# Patient Record
Sex: Female | Born: 1955 | Hispanic: No | Marital: Married | Smoking: Never smoker
Health system: Southern US, Community
[De-identification: ages and names within clinical notes are randomized; demographics above are authoritative.]

## PROBLEM LIST (undated history)

## (undated) DIAGNOSIS — E1159 Type 2 diabetes mellitus with other circulatory complications: Secondary | ICD-10-CM

## (undated) DIAGNOSIS — I152 Hypertension secondary to endocrine disorders: Secondary | ICD-10-CM

## (undated) DIAGNOSIS — Z8673 Personal history of transient ischemic attack (TIA), and cerebral infarction without residual deficits: Secondary | ICD-10-CM

## (undated) DIAGNOSIS — E119 Type 2 diabetes mellitus without complications: Secondary | ICD-10-CM

## (undated) DIAGNOSIS — M069 Rheumatoid arthritis, unspecified: Secondary | ICD-10-CM

## (undated) DIAGNOSIS — I10 Essential (primary) hypertension: Secondary | ICD-10-CM

---

## 2021-06-05 ENCOUNTER — Other Ambulatory Visit: Payer: Self-pay

## 2021-06-05 ENCOUNTER — Encounter (HOSPITAL_BASED_OUTPATIENT_CLINIC_OR_DEPARTMENT_OTHER): Payer: Self-pay | Admitting: *Deleted

## 2021-06-05 ENCOUNTER — Observation Stay (HOSPITAL_BASED_OUTPATIENT_CLINIC_OR_DEPARTMENT_OTHER)
Admission: EM | Admit: 2021-06-05 | Discharge: 2021-06-06 | Disposition: A | Discharge status: Home / Self Care | Payer: Medicaid Other | Attending: Internal Medicine | Admitting: Internal Medicine

## 2021-06-05 ENCOUNTER — Emergency Department (HOSPITAL_BASED_OUTPATIENT_CLINIC_OR_DEPARTMENT_OTHER): Payer: Medicaid Other

## 2021-06-05 ENCOUNTER — Emergency Department (HOSPITAL_COMMUNITY): Payer: Medicaid Other

## 2021-06-05 DIAGNOSIS — Z794 Long term (current) use of insulin: Secondary | ICD-10-CM | POA: Diagnosis not present

## 2021-06-05 DIAGNOSIS — Z7984 Long term (current) use of oral hypoglycemic drugs: Secondary | ICD-10-CM | POA: Insufficient documentation

## 2021-06-05 DIAGNOSIS — E1159 Type 2 diabetes mellitus with other circulatory complications: Secondary | ICD-10-CM | POA: Diagnosis present

## 2021-06-05 DIAGNOSIS — I639 Cerebral infarction, unspecified: Principal | ICD-10-CM | POA: Diagnosis present

## 2021-06-05 DIAGNOSIS — I10 Essential (primary) hypertension: Secondary | ICD-10-CM | POA: Diagnosis not present

## 2021-06-05 DIAGNOSIS — D696 Thrombocytopenia, unspecified: Secondary | ICD-10-CM | POA: Diagnosis not present

## 2021-06-05 DIAGNOSIS — Z79899 Other long term (current) drug therapy: Secondary | ICD-10-CM | POA: Diagnosis not present

## 2021-06-05 DIAGNOSIS — Z20822 Contact with and (suspected) exposure to covid-19: Secondary | ICD-10-CM | POA: Insufficient documentation

## 2021-06-05 DIAGNOSIS — E119 Type 2 diabetes mellitus without complications: Secondary | ICD-10-CM | POA: Diagnosis not present

## 2021-06-05 DIAGNOSIS — R202 Paresthesia of skin: Secondary | ICD-10-CM | POA: Diagnosis present

## 2021-06-05 DIAGNOSIS — R42 Dizziness and giddiness: Secondary | ICD-10-CM

## 2021-06-05 DIAGNOSIS — R200 Anesthesia of skin: Secondary | ICD-10-CM

## 2021-06-05 DIAGNOSIS — H538 Other visual disturbances: Secondary | ICD-10-CM

## 2021-06-05 HISTORY — DX: Type 2 diabetes mellitus without complications: E11.9

## 2021-06-05 HISTORY — DX: Essential (primary) hypertension: I10

## 2021-06-05 HISTORY — DX: Personal history of transient ischemic attack (TIA), and cerebral infarction without residual deficits: Z86.73

## 2021-06-05 HISTORY — DX: Type 2 diabetes mellitus with other circulatory complications: E11.59

## 2021-06-05 HISTORY — DX: Hypertension secondary to endocrine disorders: I15.2

## 2021-06-05 HISTORY — DX: Rheumatoid arthritis, unspecified: M06.9

## 2021-06-05 LAB — URINALYSIS, MICROSCOPIC (REFLEX)

## 2021-06-05 LAB — CBC
HCT: 44.4 % (ref 36.0–46.0)
Hemoglobin: 15.5 g/dL — ABNORMAL HIGH (ref 12.0–15.0)
MCH: 31.3 pg (ref 26.0–34.0)
MCHC: 34.9 g/dL (ref 30.0–36.0)
MCV: 89.7 fL (ref 80.0–100.0)
Platelets: 94 10*3/uL — ABNORMAL LOW (ref 150–400)
RBC: 4.95 MIL/uL (ref 3.87–5.11)
RDW: 14 % (ref 11.5–15.5)
WBC: 8.4 10*3/uL (ref 4.0–10.5)
nRBC: 0 % (ref 0.0–0.2)

## 2021-06-05 LAB — COMPREHENSIVE METABOLIC PANEL
ALT: 17 U/L (ref 0–44)
AST: 22 U/L (ref 15–41)
Albumin: 4.4 g/dL (ref 3.5–5.0)
Alkaline Phosphatase: 59 U/L (ref 38–126)
Anion gap: 12 (ref 5–15)
BUN: 17 mg/dL (ref 8–23)
CO2: 27 mmol/L (ref 22–32)
Calcium: 10.1 mg/dL (ref 8.9–10.3)
Chloride: 101 mmol/L (ref 98–111)
Creatinine, Ser: 0.97 mg/dL (ref 0.44–1.00)
GFR, Estimated: >60 mL/min (ref >60)
Glucose, Bld: 182 mg/dL — ABNORMAL HIGH (ref 70–99)
Potassium: 4 mmol/L (ref 3.5–5.1)
Sodium: 140 mmol/L (ref 135–145)
Total Bilirubin: 0.5 mg/dL (ref 0.3–1.2)
Total Protein: 7.5 g/dL (ref 6.5–8.1)

## 2021-06-05 LAB — PROTIME-INR
INR: 1 (ref 0.8–1.2)
Prothrombin Time: 13.5 seconds (ref 11.4–15.2)

## 2021-06-05 LAB — DIFFERENTIAL
Abs Immature Granulocytes: 0.03 10*3/uL (ref 0.00–0.07)
Basophils Absolute: 0 10*3/uL (ref 0.0–0.1)
Basophils Relative: 1 %
Eosinophils Absolute: 0.2 10*3/uL (ref 0.0–0.5)
Eosinophils Relative: 3 %
Immature Granulocytes: 0 %
Lymphocytes Relative: 28 %
Lymphs Abs: 2.4 10*3/uL (ref 0.7–4.0)
Monocytes Absolute: 0.7 10*3/uL (ref 0.1–1.0)
Monocytes Relative: 9 %
Neutro Abs: 5 10*3/uL (ref 1.7–7.7)
Neutrophils Relative %: 59 %
Smear Review: DECREASED

## 2021-06-05 LAB — URINALYSIS, ROUTINE W REFLEX MICROSCOPIC
Bilirubin Urine: NEGATIVE
Glucose, UA: >=500 mg/dL — AB
Ketones, ur: NEGATIVE mg/dL
Nitrite: NEGATIVE
Protein, ur: NEGATIVE mg/dL
Specific Gravity, Urine: 1.01 (ref 1.005–1.030)
pH: 6.5 (ref 5.0–8.0)

## 2021-06-05 LAB — RESP PANEL BY RT-PCR (FLU A&B, COVID) ARPGX2
Influenza A by PCR: NEGATIVE
Influenza B by PCR: NEGATIVE
SARS Coronavirus 2 by RT PCR: NEGATIVE

## 2021-06-05 LAB — RAPID URINE DRUG SCREEN, HOSP PERFORMED
Amphetamines: NOT DETECTED
Barbiturates: NOT DETECTED
Benzodiazepines: NOT DETECTED
Cocaine: NOT DETECTED
Opiates: NOT DETECTED
Tetrahydrocannabinol: NOT DETECTED

## 2021-06-05 LAB — APTT: aPTT: 24 seconds (ref 24–36)

## 2021-06-05 MED ORDER — GADOBUTROL 1 MMOL/ML IV SOLN
6.5000 mL | Freq: Once | INTRAVENOUS | Status: AC | PRN
  Administered 2021-06-05: 6.5 mL via INTRAVENOUS

## 2021-06-05 MED ORDER — ASPIRIN 81 MG PO CHEW
324.0000 mg | CHEWABLE_TABLET | Freq: Once | ORAL | Status: AC
  Administered 2021-06-06: 324 mg via ORAL
  Filled 2021-06-05: qty 4

## 2021-06-05 NOTE — ED Notes (Signed)
ED TO INPATIENT HANDOFF REPORT  ED Nurse Name and Phone #:   S Name/Age/Gender Shannon Bender 65 y.o. female Room/Bed: MH12/MH12  Code Status   Code Status: Not on file  Home/SNF/Other [facility] ED Patient oriented to: self, place, time and situation Is this baseline? Yes   Triage Complete: Triage complete  Chief Complaint head numbess and facial numbness  Triage Note Head and facial numbness since 4am (11 hours ago). Pressure started in both ears then into her head. Blurred vision. They went to a walk in clinic where they were told to come to the ER.     Allergies No Known Allergies  Level of Care/Admitting Diagnosis ED Disposition    ED Disposition  Transfer to Another Facility   Condition  --   Comment  The patient appears reasonably stabilized for transfer considering the current resources, flow, and capabilities available in the ED at this time, and I doubt any other [facility] requiring further screening and/or treatment in the ED prior to transfer is p resent.         B Medical/Surgery History Past Medical History:  Diagnosis Date  . Diabetes mellitus without complication (HCC)   . Hypertension    Past Surgical History:  Procedure Laterality Date  . CESAREAN SECTION       A IV Location/Drains/Wounds Patient Lines/Drains/Airways Status    Active Line/Drains/Airways    Name Placement date Placement time Site Days   Peripheral IV 06/05/21 20 G 1" Left Forearm 06/05/21  1544  Forearm  less than 1          Intake/Output Last 24 hours No intake or output data in the 24 hours ending 06/05/21 1904  Labs/Imaging Results for orders placed or performed during the hospital encounter of 06/05/21 (from the past 48 hour(s))  Resp Panel by RT-PCR (Flu A&B, Covid) Nasopharyngeal Swab     Status: None   Collection Time: 06/05/21  4:00 PM   Specimen: Nasopharyngeal Swab; Nasopharyngeal(NP) swabs in vial transport medium  Result Value Ref Range   SARS Coronavirus 2 by RT  PCR NEGATIVE NEGATIVE    Comment: (NOTE) SARS-CoV-2 target nucleic acids are NOT DETECTED.  The SARS-CoV-2 RNA is generally detectable in upper respiratory specimens during the acute phase of infection. The lowest concentration of SARS-CoV-2 viral copies this assay can detect is 138 copies/mL. A negative result does not preclude SARS-Cov-2 infection and should not be used as the sole basis for treatment or other patient management decisions. A negative result may occur with  improper specimen collection/handling, submission of specimen other than nasopharyngeal swab, presence of viral mutation(s) within the areas targeted by this assay, and inadequate number of viral copies(<138 copies/mL). A negative result must be combined with clinical observations, patient history, and epidemiological information. The expected result is Negative.  Fact Sheet for Patients:  BloggerCourse.com  Fact Sheet for Healthcare Providers:  SeriousBroker.it  This test is no t yet approved or cleared by the [place] FDA and  has been authorized for detection and/or diagnosis of SARS-CoV-2 by FDA under an Emergency Use Authorization (EUA). This EUA will remain  in effect (meaning this test can be used) for the duration of the COVID-19 declaration under Section 564(b)(1) of the Act, 21 U.S.C.section 360bbb-3(b)(1), unless the authorization is terminated  or revoked sooner.       Influenza A by PCR NEGATIVE NEGATIVE   Influenza B by PCR NEGATIVE NEGATIVE    Comment: (NOTE) The Xpert Xpress SARS-CoV-2/FLU/RSV plus assay is  intended as an aid in the diagnosis of influenza from Nasopharyngeal swab specimens and should not be used as a sole basis for treatment. Nasal washings and aspirates are unacceptable for Xpert Xpress SARS-CoV-2/FLU/RSV testing.  Fact Sheet for Patients: BloggerCourse.com  Fact Sheet for Healthcare  Providers: SeriousBroker.it  This test is not yet approved or cleared by the [place] FDA and has been authorized for detection and/or diagnosis of SARS-CoV-2 by FDA under an Emergency Use Authorization (EUA). This EUA will remain in effect (meaning this test can be used) for the duration of the COVID-19 declaration under Section 564(b)(1) of the Act, 21 U.S.C. section 360bbb-3(b)(1), unless the authorization is terminated or revoked.  Performed at [facility], [address] [place], [place] [address]   Protime-INR     Status: None   Collection Time: 06/05/21  4:00 PM  Result Value Ref Range   Prothrombin Time 13.5 11.4 - 15.2 seconds   INR 1.0 0.8 - 1.2    Comment: (NOTE) INR goal varies based on device and disease states. Performed at [facility], [address] [place], [place] [address]   APTT     Status: None   Collection Time: 06/05/21  4:00 PM  Result Value Ref Range   aPTT 24 24 - 36 seconds    Comment: Performed at [facility], [address] [place], [place] [address]  CBC     Status: Abnormal   Collection Time: 06/05/21  4:00 PM  Result Value Ref Range   WBC 8.4 4.0 - 10.5 K/uL   RBC 4.95 3.87 - 5.11 MIL/uL   Hemoglobin 15.5 (H) 12.0 - 15.0 g/dL   HCT 05.3 97.6 - 73.4 %   MCV 89.7 80.0 - 100.0 fL   MCH 31.3 26.0 - 34.0 pg   MCHC 34.9 30.0 - 36.0 g/dL   RDW 19.3 79.0 - 24.0 %   Platelets 94 (L) 150 - 400 K/uL    Comment: SPECIMEN CHECKED FOR CLOTS Immature Platelet Fraction may be clinically indicated, consider ordering this additional test [id number] PLATELET COUNT CONFIRMED BY SMEAR    nRBC 0.0 0.0 - 0.2 %    Comment: Performed at [facility], [address] [place], [place] [address]  Differential     Status: None   Collection Time: 06/05/21  4:00 PM  Result Value Ref Range   Neutrophils Relative % 59 %   Neutro Abs 5.0 1.7 - 7.7 K/uL   Lymphocytes Relative 28 %    Lymphs Abs 2.4 0.7 - 4.0 K/uL   Monocytes Relative 9 %   Monocytes Absolute 0.7 0.1 - 1.0 K/uL   Eosinophils Relative 3 %   Eosinophils Absolute 0.2 0.0 - 0.5 K/uL   Basophils Relative 1 %   Basophils Absolute 0.0 0.0 - 0.1 K/uL   WBC Morphology MORPHOLOGY UNREMARKABLE    RBC Morphology MORPHOLOGY UNREMARKABLE    Smear Review PLATELETS APPEAR DECREASED     Comment: PLATELET COUNT CONFIRMED BY SMEAR   Immature Granulocytes 0 %   Abs Immature Granulocytes 0.03 0.00 - 0.07 K/uL    Comment: Performed at [facility], [address] [place], [place] [address]  Comprehensive metabolic panel     Status: Abnormal   Collection Time: 06/05/21  4:00 PM  Result Value Ref Range   Sodium 140 135 - 145 mmol/L   Potassium 4.0 3.5 - 5.1 mmol/L   Chloride 101  98 - 111 mmol/L   CO2 27 22 - 32 mmol/L   Glucose, Bld 182 (H) 70 - 99 mg/dL    Comment: Glucose reference range applies only to samples taken after fasting for at least 8 hours.   BUN 17 8 - 23 mg/dL   Creatinine, Ser 1.61 0.44 - 1.00 mg/dL   Calcium 09.6 8.9 - 04.5 mg/dL   Total Protein 7.5 6.5 - 8.1 g/dL   Albumin 4.4 3.5 - 5.0 g/dL   AST 22 15 - 41 U/L   ALT 17 0 - 44 U/L   Alkaline Phosphatase 59 38 - 126 U/L   Total Bilirubin 0.5 0.3 - 1.2 mg/dL   GFR, Estimated >40 >98 mL/min    Comment: (NOTE) Calculated using the CKD-EPI Creatinine Equation (2021)    Anion gap 12 5 - 15    Comment: Performed at [facility], [address] [place], [place] [address]  Urine rapid drug screen (hosp performed)     Status: None   Collection Time: 06/05/21  4:54 PM  Result Value Ref Range   Opiates NONE DETECTED NONE DETECTED   Cocaine NONE DETECTED NONE DETECTED   Benzodiazepines NONE DETECTED NONE DETECTED   Amphetamines NONE DETECTED NONE DETECTED   Tetrahydrocannabinol NONE DETECTED NONE DETECTED   Barbiturates NONE DETECTED NONE DETECTED    Comment: (NOTE) DRUG SCREEN FOR MEDICAL PURPOSES ONLY.  IF CONFIRMATION IS  NEEDED FOR ANY PURPOSE, NOTIFY LAB WITHIN 5 DAYS.  LOWEST DETECTABLE LIMITS FOR URINE DRUG SCREEN Drug Class                     Cutoff (ng/mL) Amphetamine and metabolites    1000 Barbiturate and metabolites    200 Benzodiazepine                 200 Tricyclics and metabolites     300 Opiates and metabolites        300 Cocaine and metabolites        300 THC                            50 Performed at [facility], [address] [place], [place] [address]   Urinalysis, Routine w reflex microscopic Urine, Clean Catch     Status: Abnormal   Collection Time: 06/05/21  4:54 PM  Result Value Ref Range   Color, Urine YELLOW YELLOW   APPearance HAZY (A) CLEAR   Specific Gravity, Urine 1.010 1.005 - 1.030   pH 6.5 5.0 - 8.0   Glucose, UA >=500 (A) NEGATIVE mg/dL   Hgb urine dipstick SMALL (A) NEGATIVE   Bilirubin Urine NEGATIVE NEGATIVE   Ketones, ur NEGATIVE NEGATIVE mg/dL   Protein, ur NEGATIVE NEGATIVE mg/dL   Nitrite NEGATIVE NEGATIVE   Leukocytes,Ua MODERATE (A) NEGATIVE    Comment: Performed at [facility], [address] [place], [place] [address]  Urinalysis, Microscopic (reflex)     Status: Abnormal   Collection Time: 06/05/21  4:54 PM  Result Value Ref Range   RBC / HPF 0-5 0 - 5 RBC/hpf   WBC, UA 21-50 0 - 5 WBC/hpf   Bacteria, UA RARE (A) NONE SEEN   Squamous Epithelial / LPF 0-5 0 - 5    Comment: Performed at [facility], [address] [place], [place] [address]   CT HEAD WO CONTRAST  Result Date: 06/05/2021 CLINICAL DATA:  Neuro deficit, acute stroke suspected. Head and facial numbness since 4 a.m. (11 hours prior). EXAM: CT HEAD WITHOUT CONTRAST TECHNIQUE: Contiguous axial images were obtained from the base of the skull through the vertex without intravenous contrast. COMPARISON:  None. FINDINGS: Brain: There is no evidence of acute intracranial hemorrhage, mass lesion, brain edema or extra-axial fluid collection. There is mild  atrophy with prominence of the ventricles and subarachnoid spaces. Patchy low-density in the periventricular white matter is likely related to chronic small vessel ischemic changes. There is a small lacune within the right caudate body. There is no CT evidence of acute cortical infarction. Vascular: Prominent intracranial vascular calcifications. No hyperdense vessel identified. Skull: Negative for fracture or focal lesion. Sinuses/Orbits: The visualized paranasal sinuses and mastoid air cells are clear. No orbital abnormalities are seen. Other: None. IMPRESSION: 1. Age indeterminate periventricular white matter disease, likely due to chronic small vessel ischemic changes. 2. No CT evidence of acute cortical infarct or acute intracranial hemorrhage. Electronically Signed   By: Carey BullocksWilliam  Veazey M.D.   On: 06/05/2021 17:37    Pending Labs Unresulted Labs (From admission, onward)    Start     Ordered   06/05/21 1744  Urine Culture  Once,   STAT       Question:  Indication  Answer:  Altered mental status (if no other cause identified)   06/05/21 1744   06/05/21 1548  Ethanol  ONCE - STAT,   STAT        06/05/21 1548          Vitals/Pain Today's Vitals   06/05/21 1615 06/05/21 1630 06/05/21 1724 06/05/21 1807  BP:   (!) 150/68 (!) 141/79  Pulse: 78 81 91 79  Resp: 16 19 18 17   Temp:   98.3 F (36.8 C) 98.2 F (36.8 C)  TempSrc:   Oral Oral  SpO2: 95% 95% 96% 93%  Weight:      Height:      PainSc:        Isolation Precautions No active isolations  Medications Medications - No data to display  Mobility walks with person assist High fall risk   Focused Assessments Neuro Assessment Handoff:  Swallow screen pass? Yes    NIH Stroke Scale ( + Modified Stroke Scale Criteria)  Interval: Initial Level of Consciousness (1a.)   : Alert, keenly responsive LOC Questions (1b. )   +: Answers both questions correctly LOC Commands (1c. )   + : Performs both tasks correctly Best Gaze (2. )   +: Normal Visual (3. )  +: No visual loss Facial Palsy (4. )    : (S) Minor paralysis (left side from previous) Motor Arm, Left (5a. )   +: No drift Motor Arm, Right (5b. )   +: No drift Motor Leg, Left (6a. )   +: No drift Motor Leg, Right (6b. )   +: No drift Limb Ataxia (7. ): Absent Sensory (8. )   +: (S) Mild-to-moderate sensory loss, patient feels pinprick is less sharp or is dull on the affected side, or there is a loss of superficial pain with pinprick, but patient is aware of being touched Best Language (9. )   +: No aphasia Dysarthria (10. ): Normal Extinction/Inattention (11.)   +: No Abnormality Modified SS Total  +: 1 Complete NIHSS TOTAL: 2     Neuro Assessment: Exceptions to WDL Neuro Checks:   Initial (06/05/21 1541)  Last Documented  NIHSS Modified Score: 1 (06/05/21 1541) Has TPA been given? No If patient is a Neuro Trauma and patient is going to OR before floor call report to 4N Charge nurse: [phone] or [phone]     R Recommendations: See Admitting Provider Note  Report given to:   Additional Notes:

## 2021-06-05 NOTE — ED Provider Notes (Signed)
8:50 PM Patient presents from [facility] with right sided facial numbness and head pressure. Sent for MRI. Reports mild improvement in her symptoms.  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 Will consult neuro and admit.  11:41 PM Dr. Allena Katz to admit, Dr. Derry Lory to see as well.   Pricilla Loveless, MD 06/05/21 [phone]

## 2021-06-05 NOTE — ED Triage Notes (Signed)
Head and facial numbness since 4am (11 hours ago). Pressure started in both ears then into her head. Blurred vision. They went to a walk in clinic where they were told to come to the ER.

## 2021-06-05 NOTE — H&P (Signed)
History and Physical    Shannon Bender ZOX:[id number] DOB: 1956/02/28 DOA: 06/05/2021  PCP: Bailey Mech, PA-C  Patient coming from: [facility]  I have personally briefly reviewed patient's old medical records in [facility]  Chief Complaint: Facial numbness  HPI: Shannon Bender is a 65 y.o. female with medical history significant for history of CVA with mild residual right-sided weakness, insulin-dependent T2DM, HTN, chronic thrombocytopenia, rheumatoid arthritis on etanercept, and depression/anxiety who presented to the ED for evaluation of facial numbness.  History is supplemented by daughter at bedside.  Patient woke up morning of 06/05/2021 in her usual state of health around 4 AM.  Shortly afterward she noticed a fullness sensation in her head along with numbness in her right face.  She initially thought this was related to her seasonal allergies and returned to bed.  She again woke at 7 AM with persistent right facial numbness.  She went to [facility] ED for further evaluation and subsequently sent to [facility] ED to obtain MRI brain.  Patient has mild right-sided weakness from previous stroke about 14 years ago.  She has been on etanercept for management of rheumatoid arthritis which was discontinued a couple days ago as she felt this was making it difficult for her to control her blood sugars.  Patient is continue to complain of stuffy nose, allergies affecting her eyes.  She denies any chest pain, dyspnea, nausea, vomiting, abdominal pain.  On review of patient's medications with daughter, she has not been taking any antiplatelets including aspirin or Plavix.  ED Course:  Initial vitals showed BP 154/79, pulse 91, RR 17, temp 98.0 F, SPO2 90% on room air.  Labs show WBC 8.4, hemoglobin 15.5, platelets 94,000, sodium 140, potassium 4.0, bicarb 27, BUN 17, creatinine 0.97, serum glucose 182, LFTs within normal limits.  UDS negative.  Urinalysis  shows negative nitrites, moderate leukocytes, 0-5 RBC/hpf, 21-50 WBC/hpf, rare bacteria microscopy.  Urine culture was obtained and pending.  SARS-CoV-2 PCR negative.  Influenza is negative.  CT head without contrast showed age-indeterminate periventricular white matter disease without CT evidence of acute cortical infarct or acute intracranial hemorrhage.  MRI brain without contrast and MRA head without contrast showed small focus of acute ischemia within the inferior right pons.  No hemorrhage or mass-effect.  Advanced atrophy and chronic small vessel disease.  Normal intracranial MRA.  MRI neck with and without contrast showed approximately 50% stenosis of the proximal right ICA.  No left carotid or vertebral artery stenosis.  Patient was given aspirin 324 mg once.  Neurology were consulted and will evaluate.  The hospitalist service was consulted for further evaluation management.  Review of Systems: All systems reviewed and are negative except as documented in history of present illness above.   Past Medical History:  Diagnosis Date   History of stroke    Hypertension associated with diabetes (HCC)    Insulin dependent type 2 diabetes mellitus (HCC)    Rheumatoid arthritis (HCC)     Past Surgical History:  Procedure Laterality Date   CESAREAN SECTION      Social History:  reports that she has never smoked. She has never used smokeless tobacco. She reports that she does not drink alcohol and does not use drugs.  No Known Allergies  Family History  Problem Relation Age of Onset   Diabetes Mother    Hypertension Mother    Heart disease Father      Prior to Admission medications  Medication Sig Start Date End Date Taking? Authorizing Provider  allopurinol (ZYLOPRIM) 300 MG tablet Take 300 mg by mouth daily. 04/16/21  Yes [provider]  amLODipine (NORVASC) 5 MG tablet Take 5 mg by mouth daily. 04/16/21  Yes [provider]  canagliflozin (INVOKANA) 100  MG TABS tablet Take 100 mg by mouth daily before breakfast.   Yes [provider]  cetirizine (ZYRTEC) 10 MG tablet Take 10 mg by mouth daily as needed for allergies. 05/18/21  Yes [provider]  escitalopram (LEXAPRO) 10 MG tablet Take 10 mg by mouth daily. 04/16/21  Yes [provider]  Etanercept (ENBREL MINI) 50 MG/ML SOCT Inject 50 mg into the skin once a week. Sundays 12/02/20  Yes [provider]  folic acid (FOLVITE) 1 MG tablet Take 1 mg by mouth daily. 04/08/21  Yes [provider]  ibuprofen (ADVIL) 200 MG tablet Take 400 mg by mouth every 6 (six) hours as needed for mild pain.   Yes [provider]  insulin NPH-regular Human (70-30) 100 UNIT/ML injection Inject 10-20 Units into the skin 2 (two) times daily. Takes 20 units in the morning and 10 units in the evening   Yes [provider]  losartan (COZAAR) 25 MG tablet Take 25 mg by mouth daily. 04/16/21  Yes [provider]  metFORMIN (GLUCOPHAGE) 850 MG tablet Take 850 mg by mouth 2 (two) times daily with a meal.   Yes [provider]  sitaGLIPtin (JANUVIA) 50 MG tablet Take 50 mg by mouth daily.   Yes [provider]  triamcinolone cream (KENALOG) 0.5 % Apply 1 application topically daily as needed (itching). 02/23/21  Yes [provider]    Physical Exam: Vitals:   06/05/21 1830 06/05/21 2030 06/05/21 2100 06/05/21 2145  BP: (!) 152/71 (!) 164/98 (!) 161/77 (!) 145/97  Pulse: 81 83 83 85  Resp: 18 16 14  (!) 22  Temp:      TempSrc:      SpO2: 96% 97% 98% 96%  Weight:      Height:       Constitutional: Sitting up on side of bed, NAD, calm, comfortable Eyes: PERRL, lids and conjunctivae normal ENMT: Mucous membranes are moist. Posterior pharynx clear of any exudate or lesions.Normal dentition.  Neck: normal, supple, no masses. Respiratory: clear to auscultation bilaterally, no wheezing, no crackles. Normal respiratory effort. No  accessory muscle use.  Cardiovascular: Regular rate and rhythm, no murmurs / rubs / gallops. No extremity edema. 2+ pedal pulses. Abdomen: no tenderness, no masses palpated. No hepatosplenomegaly.  Musculoskeletal: no clubbing / cyanosis. No joint deformity upper and lower extremities. Good ROM, no contractures. Normal muscle tone.  Skin: no rashes, lesions, ulcers. No induration Neurologic: CN 2-12 grossly intact. Sensation diminished right face. Strength 4/5 RLE otherwise 5/5 other extremities. Psychiatric: Normal judgment and insight. Alert and oriented x 3. Normal mood.   Labs on Admission: I have personally reviewed following labs and imaging studies  CBC: Recent Labs  Lab 06/05/21 1600  WBC 8.4  NEUTROABS 5.0  HGB 15.5*  HCT 44.4  MCV 89.7  PLT 94*   Basic Metabolic Panel: Recent Labs  Lab 06/05/21 1600  NA 140  K 4.0  CL 101  CO2 27  GLUCOSE 182*  BUN 17  CREATININE 0.97  CALCIUM 10.1   GFR: Estimated Creatinine Clearance: 51.8 mL/min (by C-G formula based on SCr of 0.97 mg/dL). Liver Function Tests: Recent Labs  Lab 06/05/21 1600  AST  22  ALT 17  ALKPHOS 59  BILITOT 0.5  PROT 7.5  ALBUMIN 4.4   No results for input(s): LIPASE, AMYLASE in the last 168 hours. No results for input(s): AMMONIA in the last 168 hours. Coagulation Profile: Recent Labs  Lab 06/05/21 1600  INR 1.0   Cardiac Enzymes: No results for input(s): CKTOTAL, CKMB, CKMBINDEX, TROPONINI in the last 168 hours. BNP (last 3 results) No results for input(s): PROBNP in the last 8760 hours. HbA1C: No results for input(s): HGBA1C in the last 72 hours. CBG: No results for input(s): GLUCAP in the last 168 hours. Lipid Profile: No results for input(s): CHOL, HDL, LDLCALC, TRIG, CHOLHDL, LDLDIRECT in the last 72 hours. Thyroid Function Tests: No results for input(s): TSH, T4TOTAL, FREET4, T3FREE, THYROIDAB in the last 72 hours. Anemia Panel: No results for input(s): VITAMINB12, FOLATE,  FERRITIN, TIBC, IRON, RETICCTPCT in the last 72 hours. Urine analysis:    Component Value Date/Time   COLORURINE YELLOW 06/05/2021 1654   APPEARANCEUR HAZY (A) 06/05/2021 1654   LABSPEC 1.010 06/05/2021 1654   PHURINE 6.5 06/05/2021 1654   GLUCOSEU >=500 (A) 06/05/2021 1654   HGBUR SMALL (A) 06/05/2021 1654   BILIRUBINUR NEGATIVE 06/05/2021 1654   KETONESUR NEGATIVE 06/05/2021 1654   PROTEINUR NEGATIVE 06/05/2021 1654   NITRITE NEGATIVE 06/05/2021 1654   LEUKOCYTESUR MODERATE (A) 06/05/2021 1654    Radiological Exams on Admission: CT HEAD WO CONTRAST  Result Date: 06/05/2021 CLINICAL DATA:  Neuro deficit, acute stroke suspected. Head and facial numbness since 4 a.m. (11 hours prior). EXAM: CT HEAD WITHOUT CONTRAST TECHNIQUE: Contiguous axial images were obtained from the base of the skull through the vertex without intravenous contrast. COMPARISON:  None. FINDINGS: Brain: There is no evidence of acute intracranial hemorrhage, mass lesion, brain edema or extra-axial fluid collection. There is mild atrophy with prominence of the ventricles and subarachnoid spaces. Patchy low-density in the periventricular white matter is likely related to chronic small vessel ischemic changes. There is a small lacune within the right caudate body. There is no CT evidence of acute cortical infarction. Vascular: Prominent intracranial vascular calcifications. No hyperdense vessel identified. Skull: Negative for fracture or focal lesion. Sinuses/Orbits: The visualized paranasal sinuses and mastoid air cells are clear. No orbital abnormalities are seen. Other: None. IMPRESSION: 1. Age indeterminate periventricular white matter disease, likely due to chronic small vessel ischemic changes. 2. No CT evidence of acute cortical infarct or acute intracranial hemorrhage. Electronically Signed   By: Carey BullocksWilliam  Veazey M.D.   On: 06/05/2021 17:37   MR ANGIO HEAD WO CONTRAST  Result Date: 06/05/2021 CLINICAL DATA:  Acute  neurologic deficit EXAM: MRI HEAD WITHOUT CONTRAST MRA HEAD WITHOUT CONTRAST TECHNIQUE: Multiplanar, multi-echo pulse sequences of the brain and surrounding structures were acquired without intravenous contrast. Angiographic images of the Circle of Willis were acquired using MRA technique without intravenous contrast. COMPARISON:  No pertinent prior exam. FINDINGS: MRI HEAD FINDINGS Brain: Small focus of abnormal diffusion restriction within the inferior right pons. Chronic microhemorrhage in the right temporal lobe. Hyperintense T2-weighted signal is moderately widespread throughout the white matter. Advanced atrophy for age. There are old bilateral cerebellar small vessel infarcts. The midline structures are normal. Vascular: Major flow voids are preserved. Skull and upper cervical spine: Normal calvarium and skull base. Visualized upper cervical spine and soft tissues are normal. Sinuses/Orbits:No paranasal sinus fluid levels or advanced mucosal thickening. No mastoid or middle ear effusion. Normal orbits. MRA HEAD FINDINGS POSTERIOR CIRCULATION: --Vertebral arteries: Normal --Inferior cerebellar arteries:  Normal. --Basilar artery: Normal. --Superior cerebellar arteries: Normal. --Posterior cerebral arteries: Narrowing of the proximal right P2 segment. Otherwise normal. ANTERIOR CIRCULATION: --Intracranial internal carotid arteries: Normal. --Anterior cerebral arteries (ACA): Normal. --Middle cerebral arteries (MCA): Normal. ANATOMIC VARIANTS: None IMPRESSION: 1. Small focus of acute ischemia within the inferior right pons. No hemorrhage or mass effect. 2. Advanced atrophy and chronic small vessel disease. 3. Normal intracranial MRA. Electronically Signed   By: Deatra Robinson M.D.   On: 06/05/2021 23:16   MR Angiogram Neck W or Wo Contrast  Result Date: 06/05/2021 CLINICAL DATA:  Acute neurologic deficit EXAM: MRA NECK WITHOUT AND WITH CONTRAST TECHNIQUE: Multiplanar and multiecho pulse sequences of the neck  were obtained without and with intravenous contrast. Angiographic images of the neck were obtained using MRA technique without and with intravenous contrast. CONTRAST:  6.6mL GADAVIST GADOBUTROL 1 MMOL/ML IV SOLN COMPARISON:  None. FINDINGS: There is approximately 50% stenosis of the proximal right internal carotid artery. The left carotid system and both vertebral arteries are normal. Three-vessel branching pattern of the aortic arch. IMPRESSION: 1. Approximately 50% stenosis of the proximal right internal carotid artery. 2. No left carotid or vertebral artery stenosis. Electronically Signed   By: Deatra Robinson M.D.   On: 06/05/2021 23:46   MR BRAIN WO CONTRAST  Result Date: 06/05/2021 CLINICAL DATA:  Acute neurologic deficit EXAM: MRI HEAD WITHOUT CONTRAST MRA HEAD WITHOUT CONTRAST TECHNIQUE: Multiplanar, multi-echo pulse sequences of the brain and surrounding structures were acquired without intravenous contrast. Angiographic images of the Circle of Willis were acquired using MRA technique without intravenous contrast. COMPARISON:  No pertinent prior exam. FINDINGS: MRI HEAD FINDINGS Brain: Small focus of abnormal diffusion restriction within the inferior right pons. Chronic microhemorrhage in the right temporal lobe. Hyperintense T2-weighted signal is moderately widespread throughout the white matter. Advanced atrophy for age. There are old bilateral cerebellar small vessel infarcts. The midline structures are normal. Vascular: Major flow voids are preserved. Skull and upper cervical spine: Normal calvarium and skull base. Visualized upper cervical spine and soft tissues are normal. Sinuses/Orbits:No paranasal sinus fluid levels or advanced mucosal thickening. No mastoid or middle ear effusion. Normal orbits. MRA HEAD FINDINGS POSTERIOR CIRCULATION: --Vertebral arteries: Normal --Inferior cerebellar arteries: Normal. --Basilar artery: Normal. --Superior cerebellar arteries: Normal. --Posterior cerebral  arteries: Narrowing of the proximal right P2 segment. Otherwise normal. ANTERIOR CIRCULATION: --Intracranial internal carotid arteries: Normal. --Anterior cerebral arteries (ACA): Normal. --Middle cerebral arteries (MCA): Normal. ANATOMIC VARIANTS: None IMPRESSION: 1. Small focus of acute ischemia within the inferior right pons. No hemorrhage or mass effect. 2. Advanced atrophy and chronic small vessel disease. 3. Normal intracranial MRA. Electronically Signed   By: Deatra Robinson M.D.   On: 06/05/2021 23:16    EKG: Personally reviewed. Normal sinus rhythm without acute ischemic changes.  No prior for comparison.  Assessment/Plan Active Problems:   Acute CVA (cerebrovascular accident) (HCC)   Insulin dependent type 2 diabetes mellitus (HCC)   Hypertension associated with diabetes (HCC)   Thrombocytopenia (HCC)   Shannon Bender is a 65 y.o. female with medical history significant for history of CVA with mild residual right-sided weakness, insulin-dependent T2DM, HTN, chronic thrombocytopenia, rheumatoid arthritis on etanercept, and depression/anxiety who is admitted with acute right pontine stroke.  Acute right inferior pontine ischemic stroke History of CVA with mild residual right-sided weakness: Seen on MRI brain.  MRA head without intracranial abnormality.  MRA neck shows approximately 50% proximal right ICA stenosis. -Neurology following -Receiving aspirin 325 mg once now -Will  start on DAPT with aspirin 81 mg daily and Plavix 75 mg daily x21 days followed by aspirin alone -Obtain echocardiogram -Check A1c, lipid panel -Start on atorvastatin 40 mg daily -Monitor on telemetry, continue neurochecks -PT/OT/SLP eval -Allow permissive hypertension for now  Hypertension: Holding home amlodipine, losartan to allow permissive hypertension for now.  Insulin-dependent type 2 diabetes: Placed on reduced home insulin 70/30 10 units BID.  Add SSI.  Check A1c.  Holding Invokana and  metformin.  Thrombocytopenia: Chronic without obvious bleeding.  Continue to monitor.  Rheumatoid arthritis: Has been on etanercept, reportedly discontinued couple days prior to admission.  Depression/anxiety: Continue Lexapro.  DVT prophylaxis: Lovenox Code Status: Full code Family Communication: Discussed with family at bedside Disposition Plan: From home, dispo pending clinical progress Consults called: Neurology Level of care: Telemetry Medical Admission status:  Status is: Observation  The patient remains OBS appropriate and will d/c before 2 midnights.  Dispo: The patient is from: Home              Anticipated d/c is to: Home              Patient currently is not medically stable to d/c.   Difficult to place patient No   Darreld Mclean MD [facility]  If 7PM-7AM, please contact night-coverage [email or web address]  06/06/2021, 1:02 AM

## 2021-06-05 NOTE — ED Provider Notes (Signed)
[facility] EMERGENCY DEPARTMENT Provider Note   CSN: [id number] Arrival date & time: 06/05/21  1456     History Chief Complaint  Patient presents with   Numbness    Shannon Bender is a 65 y.o. female.  HPI Patient had onset of numbness to the right side of her face at 4 AM.  She had gotten up for morning prayers.  She was with her daughter.  Patient first had a feeling of a sudden pressure on the side of her head and in her ear that then radiated across the top of her head.  From that time she persisted in having some perception of numbness on the face.  Symptoms evolved at about 9 and 10 with associated dizziness and blurred vision.  Patient did not have loss of vision.  She did not have a fall or collapse.  However she felt unsteady then with walking and persistent right-sided facial numbness.  Patient has a distant history of stroke 14 years ago.  She has a appearance of left facial droop which her daughter reports is from prior stroke.  She also has some residual right-sided weakness from prior stroke.  Patient does not perceive that she had any new or changed weakness in her arms or her legs.  She reports that the pressure and discomfort that she felt initially at symptoms onset is less so at this time.  She does feel that her vision however remains blurred.  Patient was feeling well prior to symptoms onset.  She has not been sick.  No recent fevers chills or general illness.  No associated chest pain or palpitation.    Past Medical History:  Diagnosis Date   Diabetes mellitus without complication (HCC)    Hypertension     There are no problems to display for this patient.   Past Surgical History:  Procedure Laterality Date   CESAREAN SECTION       OB History   No obstetric history on file.     No family history on file.  Social History   Tobacco Use   Smoking status: Never   Smokeless tobacco: Never  Vaping Use   Vaping Use: Never used  Substance Use  Topics   Alcohol use: Never   Drug use: Never    Home Medications Prior to Admission medications   Medication Sig Start Date End Date Taking? Authorizing Provider  amLODipine (NORVASC) 5 MG tablet Take 5 mg by mouth daily.   Yes [provider]  canagliflozin (INVOKANA) 100 MG TABS tablet Take by mouth daily before breakfast.   Yes [provider]  diclofenac (VOLTAREN) 50 MG EC tablet Take by mouth 2 (two) times daily.   Yes [provider]  insulin NPH-regular Human (70-30) 100 UNIT/ML injection Inject 100 Units into the skin.   Yes [provider]  metFORMIN (GLUCOPHAGE) 850 MG tablet Take 850 mg by mouth 2 (two) times daily with a meal.   Yes [provider]  sitaGLIPtin (JANUVIA) 50 MG tablet Take 50 mg by mouth daily.   Yes [provider]    Allergies    Patient has no known allergies.  Review of Systems   Review of Systems 10 systems reviewed and negative except as per HPI Physical Exam Updated Vital Signs BP 140/66   Pulse 83   Temp 98 F (36.7 C)   Resp 17   Ht 5\' 1"  (1.549 m)   Wt 68.3 kg   SpO2 93%  BMI 28.46 kg/m   Physical Exam Constitutional:      Comments: Alert nontoxic.  No respiratory distress.  Verbally interactive with her daughter and interpreter.  HENT:     Head: Normocephalic and atraumatic.     Right Ear: Tympanic membrane normal.     Left Ear: Tympanic membrane normal.     Nose: Nose normal.     Mouth/Throat:     Mouth: Mucous membranes are moist.     Pharynx: Oropharynx is clear.  Eyes:     Extraocular Movements: Extraocular movements intact.     Pupils: Pupils are equal, round, and reactive to light.  Cardiovascular:     Rate and Rhythm: Normal rate and regular rhythm.  Pulmonary:     Effort: Pulmonary effort is normal.     Breath sounds: Normal breath sounds.  Abdominal:     General: There is no distension.     Palpations: Abdomen is soft.     Tenderness: There is no abdominal  tenderness. There is no guarding.  Musculoskeletal:        General: No swelling or tenderness. Normal range of motion.     Cervical back: Neck supple.     Right lower leg: No edema.     Left lower leg: No edema.  Skin:    General: Skin is warm and dry.  Neurological:     Comments: Patient is speaking with her daughter and interpreter but her speech sounds clear.  She appears well oriented and is following commands as given.  Patient endorses decreased sensation to light touch on the right side of the face from the cheek to the jaw.  She reports sensation normal on the forehead.  Patient does have an objective left mouth droop.  Airway clear.  No pronator drift.  Patient has good grip strength bilaterally.  Good resistance against extension and push pull in both arms.  Patient has decreased ability to elevate and hold the right lower extremity which is reported as baseline.  Patient can elevate and hold left lower extremity and hold against resistance.  Psychiatric:        Mood and Affect: Mood normal.    ED Results / Procedures / Treatments   Labs (all labs ordered are listed, but only abnormal results are displayed) Labs Reviewed  RESP PANEL BY RT-PCR (FLU A&B, COVID) ARPGX2  ETHANOL  PROTIME-INR  APTT  CBC  DIFFERENTIAL  COMPREHENSIVE METABOLIC PANEL  RAPID URINE DRUG SCREEN, HOSP PERFORMED  URINALYSIS, ROUTINE W REFLEX MICROSCOPIC    EKG None  Radiology No results found.  Procedures Procedures   Medications Ordered in ED Medications - No data to display  ED Course  I have reviewed the triage vital signs and the nursing notes.  Pertinent labs & imaging results that were available during my care of the patient were reviewed by me and considered in my medical decision making (see chart for details).    MDM Rules/Calculators/A&P                           Patient has history of prior stroke reported to be 14 years ago.  Today she has new onset of right-sided facial  numbness with complaint of blurred vision.  No apparent new motor deficits on physical exam.  Patient's symptoms onset is 11 hours ago.  At this time we will proceed with stroke work-up and anticipate transfer for MRI.  Patient currently stable.  Clear mental  status and stable airway.  Consult: Reviewed with Dr. Selina Cooley.  Recommends MRI/MRA.  If within normal limits anticipate outpatient follow-up.  Patient be transferred for further studies. Final Clinical Impression(s) / ED Diagnoses Final diagnoses:  Numbness  Blurred vision, bilateral  Dizziness    Rx / DC Orders ED Discharge Orders     None        Arby Barrette, MD 06/06/21 1340

## 2021-06-05 NOTE — ED Notes (Signed)
Patient complaining of allergies at the moment with consistent sneezing

## 2021-06-05 NOTE — ED Notes (Signed)
Patient transported to MRI 

## 2021-06-05 NOTE — ED Triage Notes (Signed)
Pt bib [facility] from [facility]. LNW 0400, c/o facial numbness with blurred vision. Initial and last NIH score of 2. CT from  [facility] was unremarkable, transferred to [facility] for [facility].   Last [facility] vitals: 148/75 96% room air HR 70s-80s GCS 15

## 2021-06-06 ENCOUNTER — Encounter: Payer: Self-pay | Admitting: Internal Medicine

## 2021-06-06 ENCOUNTER — Encounter (HOSPITAL_COMMUNITY): Payer: Self-pay | Admitting: Internal Medicine

## 2021-06-06 DIAGNOSIS — R200 Anesthesia of skin: Secondary | ICD-10-CM

## 2021-06-06 DIAGNOSIS — I152 Hypertension secondary to endocrine disorders: Secondary | ICD-10-CM | POA: Insufficient documentation

## 2021-06-06 DIAGNOSIS — E1159 Type 2 diabetes mellitus with other circulatory complications: Secondary | ICD-10-CM | POA: Diagnosis not present

## 2021-06-06 DIAGNOSIS — E119 Type 2 diabetes mellitus without complications: Secondary | ICD-10-CM | POA: Diagnosis not present

## 2021-06-06 DIAGNOSIS — I639 Cerebral infarction, unspecified: Secondary | ICD-10-CM | POA: Diagnosis not present

## 2021-06-06 DIAGNOSIS — Z794 Long term (current) use of insulin: Secondary | ICD-10-CM

## 2021-06-06 DIAGNOSIS — Z8673 Personal history of transient ischemic attack (TIA), and cerebral infarction without residual deficits: Secondary | ICD-10-CM

## 2021-06-06 DIAGNOSIS — D696 Thrombocytopenia, unspecified: Secondary | ICD-10-CM | POA: Diagnosis not present

## 2021-06-06 LAB — HEMOGLOBIN A1C
Hgb A1c MFr Bld: 7.8 % — ABNORMAL HIGH (ref 4.8–5.6)
Mean Plasma Glucose: 177.16 mg/dL

## 2021-06-06 LAB — CBG MONITORING, ED
Glucose-Capillary: 193 mg/dL — ABNORMAL HIGH (ref 70–99)
Glucose-Capillary: 287 mg/dL — ABNORMAL HIGH (ref 70–99)

## 2021-06-06 LAB — LIPID PANEL
Cholesterol: 159 mg/dL (ref 0–200)
HDL: 38 mg/dL — ABNORMAL LOW (ref >40)
LDL Cholesterol: 76 mg/dL (ref 0–99)
Total CHOL/HDL Ratio: 4.2 RATIO
Triglycerides: 225 mg/dL — ABNORMAL HIGH (ref <150)
VLDL: 45 mg/dL — ABNORMAL HIGH (ref 0–40)

## 2021-06-06 LAB — HIV ANTIBODY (ROUTINE TESTING W REFLEX): HIV Screen 4th Generation wRfx: NONREACTIVE

## 2021-06-06 MED ORDER — CLOPIDOGREL BISULFATE 75 MG PO TABS: 75.0000 mg | ORAL_TABLET | Freq: Every day | ORAL | 0 refills | Status: AC

## 2021-06-06 MED ORDER — INSULIN ASPART PROT & ASPART (70-30 MIX) 100 UNIT/ML ~~LOC~~ SUSP
10.0000 [IU] | Freq: Two times a day (BID) | SUBCUTANEOUS | Status: DC
  Administered 2021-06-06: 10 [IU] via SUBCUTANEOUS
  Filled 2021-06-06: qty 10

## 2021-06-06 MED ORDER — STROKE: EARLY STAGES OF RECOVERY BOOK
Freq: Once | Status: DC
  Filled 2021-06-06: qty 1

## 2021-06-06 MED ORDER — ACETAMINOPHEN 160 MG/5ML PO SOLN: 650.0000 mg | ORAL | Status: DC | PRN

## 2021-06-06 MED ORDER — LORATADINE 10 MG PO TABS
10.0000 mg | ORAL_TABLET | Freq: Every day | ORAL | Status: DC
  Administered 2021-06-06: 10 mg via ORAL
  Filled 2021-06-06 (×2): qty 1

## 2021-06-06 MED ORDER — ACETAMINOPHEN 650 MG RE SUPP: 650.0000 mg | RECTAL | Status: DC | PRN

## 2021-06-06 MED ORDER — ESCITALOPRAM OXALATE 10 MG PO TABS
10.0000 mg | ORAL_TABLET | Freq: Every day | ORAL | Status: DC
  Administered 2021-06-06: 10 mg via ORAL
  Filled 2021-06-06: qty 1

## 2021-06-06 MED ORDER — ASPIRIN 81 MG PO TBEC: 81.0000 mg | DELAYED_RELEASE_TABLET | Freq: Every day | ORAL | 11 refills | Status: AC

## 2021-06-06 MED ORDER — ATORVASTATIN CALCIUM 40 MG PO TABS
40.0000 mg | ORAL_TABLET | Freq: Every day | ORAL | Status: DC
  Administered 2021-06-06: 40 mg via ORAL
  Filled 2021-06-06: qty 1

## 2021-06-06 MED ORDER — PANTOPRAZOLE SODIUM 40 MG PO TBEC: 40.0000 mg | DELAYED_RELEASE_TABLET | Freq: Every day | ORAL | 0 refills | Status: AC

## 2021-06-06 MED ORDER — ASPIRIN EC 81 MG PO TBEC: 81.0000 mg | DELAYED_RELEASE_TABLET | Freq: Every day | ORAL | Status: DC

## 2021-06-06 MED ORDER — ATORVASTATIN CALCIUM 40 MG PO TABS: 40.0000 mg | ORAL_TABLET | Freq: Every day | ORAL | 1 refills | Status: AC

## 2021-06-06 MED ORDER — CLOPIDOGREL BISULFATE 75 MG PO TABS
75.0000 mg | ORAL_TABLET | Freq: Every day | ORAL | Status: DC
Start: 2021-06-06 — End: 2021-06-06
  Administered 2021-06-06: 75 mg via ORAL
  Filled 2021-06-06: qty 1

## 2021-06-06 MED ORDER — INSULIN ASPART 100 UNIT/ML IJ SOLN
0.0000 [IU] | Freq: Three times a day (TID) | INTRAMUSCULAR | Status: DC
  Administered 2021-06-06: 2 [IU] via SUBCUTANEOUS

## 2021-06-06 MED ORDER — SENNOSIDES-DOCUSATE SODIUM 8.6-50 MG PO TABS: 1.0000 | ORAL_TABLET | Freq: Every evening | ORAL | Status: DC | PRN

## 2021-06-06 MED ORDER — ACETAMINOPHEN 325 MG PO TABS: 650.0000 mg | ORAL_TABLET | ORAL | Status: DC | PRN

## 2021-06-06 MED ORDER — ENOXAPARIN SODIUM 40 MG/0.4ML IJ SOSY
40.0000 mg | PREFILLED_SYRINGE | INTRAMUSCULAR | Status: DC
  Administered 2021-06-06: 40 mg via SUBCUTANEOUS
  Filled 2021-06-06: qty 0.4

## 2021-06-06 NOTE — ED Notes (Signed)
Provider at bedside

## 2021-06-06 NOTE — Progress Notes (Signed)
STROKE TEAM PROGRESS NOTE   INTERVAL HISTORY Her daughter is at the bedside.  Patient laying in bed, still complaining of right facial numbness.  Per daughter, she still has chronic left facial droop, right-sided weakness from prior stroke.  MRI showed right dorsal pontine infarct.  PT/OT recommend home health PT/OT.   OBJECTIVE Vitals:   06/06/21 0700 06/06/21 0900 06/06/21 1000 06/06/21 1100  BP: (!) 162/78 135/86 (!) 148/70 (!) 153/67  Pulse: 84 83 78 89  Resp: (!) 22 18 16 19   Temp: 98.2 F (36.8 C)     TempSrc: Oral     SpO2: 96% 96% 96% 94%  Weight:      Height:        CBC:  Recent Labs  Lab 06/05/21 1600  WBC 8.4  NEUTROABS 5.0  HGB 15.5*  HCT 44.4  MCV 89.7  PLT 94*    Basic Metabolic Panel:  Recent Labs  Lab 06/05/21 1600  NA 140  K 4.0  CL 101  CO2 27  GLUCOSE 182*  BUN 17  CREATININE 0.97  CALCIUM 10.1    Lipid Panel:     Component Value Date/Time   CHOL 159 06/06/2021 0301   TRIG 225 (H) 06/06/2021 0301   HDL 38 (L) 06/06/2021 0301   CHOLHDL 4.2 06/06/2021 0301   VLDL 45 (H) 06/06/2021 0301   LDLCALC 76 06/06/2021 0301   HgbA1c:  Lab Results  Component Value Date   HGBA1C 7.8 (H) 06/06/2021   Urine Drug Screen:     Component Value Date/Time   LABOPIA NONE DETECTED 06/05/2021 1654   COCAINSCRNUR NONE DETECTED 06/05/2021 1654   LABBENZ NONE DETECTED 06/05/2021 1654   AMPHETMU NONE DETECTED 06/05/2021 1654   THCU NONE DETECTED 06/05/2021 1654   LABBARB NONE DETECTED 06/05/2021 1654    Alcohol Level No results found for: ETH  IMAGING  CT HEAD WO CONTRAST Result Date: 06/05/2021 CLINICAL DATA:  Neuro deficit, acute stroke suspected. Head and facial numbness since 4 a.m. (11 hours prior). EXAM: CT HEAD WITHOUT CONTRAST TECHNIQUE: Contiguous axial images were obtained from the base of the skull through the vertex without intravenous contrast. COMPARISON:  None. FINDINGS: Brain: There is no evidence of acute intracranial hemorrhage, mass  lesion, brain edema or extra-axial fluid collection. There is mild atrophy with prominence of the ventricles and subarachnoid spaces. Patchy low-density in the periventricular white matter is likely related to chronic small vessel ischemic changes. There is a small lacune within the right caudate body. There is no CT evidence of acute cortical infarction. Vascular: Prominent intracranial vascular calcifications. No hyperdense vessel identified. Skull: Negative for fracture or focal lesion. Sinuses/Orbits: The visualized paranasal sinuses and mastoid air cells are clear. No orbital abnormalities are seen. Other: None. IMPRESSION:  1. Age indeterminate periventricular white matter disease, likely due to chronic small vessel ischemic changes.  2. No CT evidence of acute cortical infarct or acute intracranial hemorrhage.  Electronically Signed   By: 08/05/2021 M.D.   On: 06/05/2021 17:37   MR ANGIO HEAD WO CONTRAST Result Date: 06/05/2021 CLINICAL DATA:  Acute neurologic deficit EXAM: MRI HEAD WITHOUT CONTRAST MRA HEAD WITHOUT CONTRAST TECHNIQUE: Multiplanar, multi-echo pulse sequences of the brain and surrounding structures were acquired without intravenous contrast. Angiographic images of the Circle of Willis were acquired using MRA technique without intravenous contrast. COMPARISON:  No pertinent prior exam. FINDINGS: MRI HEAD FINDINGS Brain: Small focus of abnormal diffusion restriction within the inferior right pons. Chronic microhemorrhage in the  right temporal lobe. Hyperintense T2-weighted signal is moderately widespread throughout the white matter. Advanced atrophy for age. There are old bilateral cerebellar small vessel infarcts. The midline structures are normal. Vascular: Major flow voids are preserved. Skull and upper cervical spine: Normal calvarium and skull base. Visualized upper cervical spine and soft tissues are normal. Sinuses/Orbits:No paranasal sinus fluid levels or advanced mucosal  thickening. No mastoid or middle ear effusion. Normal orbits. MRA HEAD FINDINGS POSTERIOR CIRCULATION: --Vertebral arteries: Normal --Inferior cerebellar arteries: Normal. --Basilar artery: Normal. --Superior cerebellar arteries: Normal. --Posterior cerebral arteries: Narrowing of the proximal right P2 segment. Otherwise normal. ANTERIOR CIRCULATION: --Intracranial internal carotid arteries: Normal. --Anterior cerebral arteries (ACA): Normal. --Middle cerebral arteries (MCA): Normal. ANATOMIC VARIANTS: None IMPRESSION:  1. Small focus of acute ischemia within the inferior right pons. No hemorrhage or mass effect.  2. Advanced atrophy and chronic small vessel disease.  3. Normal intracranial MRA.  Electronically Signed   By: Deatra Robinson M.D.   On: 06/05/2021 23:16   MR Angiogram Neck W or Wo Contrast Result Date: 06/05/2021 CLINICAL DATA:  Acute neurologic deficit EXAM: MRA NECK WITHOUT AND WITH CONTRAST TECHNIQUE: Multiplanar and multiecho pulse sequences of the neck were obtained without and with intravenous contrast. Angiographic images of the neck were obtained using MRA technique without and with intravenous contrast. CONTRAST:  6.72mL GADAVIST GADOBUTROL 1 MMOL/ML IV SOLN COMPARISON:  None. FINDINGS: There is approximately 50% stenosis of the proximal right internal carotid artery. The left carotid system and both vertebral arteries are normal. Three-vessel branching pattern of the aortic arch.  IMPRESSION:  1. Approximately 50% stenosis of the proximal right internal carotid artery.  2. No left carotid or vertebral artery stenosis.  Electronically Signed   By: Deatra Robinson M.D.   On: 06/05/2021 23:46   MR BRAIN WO CONTRAST Result Date: 06/05/2021 CLINICAL DATA:  Acute neurologic deficit EXAM: MRI HEAD WITHOUT CONTRAST MRA HEAD WITHOUT CONTRAST TECHNIQUE: Multiplanar, multi-echo pulse sequences of the brain and surrounding structures were acquired without intravenous contrast. Angiographic images  of the Circle of Willis were acquired using MRA technique without intravenous contrast. COMPARISON:  No pertinent prior exam. FINDINGS: MRI HEAD FINDINGS Brain: Small focus of abnormal diffusion restriction within the inferior right pons. Chronic microhemorrhage in the right temporal lobe. Hyperintense T2-weighted signal is moderately widespread throughout the white matter. Advanced atrophy for age. There are old bilateral cerebellar small vessel infarcts. The midline structures are normal. Vascular: Major flow voids are preserved. Skull and upper cervical spine: Normal calvarium and skull base. Visualized upper cervical spine and soft tissues are normal. Sinuses/Orbits:No paranasal sinus fluid levels or advanced mucosal thickening. No mastoid or middle ear effusion. Normal orbits. MRA HEAD FINDINGS POSTERIOR CIRCULATION: --Vertebral arteries: Normal --Inferior cerebellar arteries: Normal. --Basilar artery: Normal. --Superior cerebellar arteries: Normal. --Posterior cerebral arteries: Narrowing of the proximal right P2 segment. Otherwise normal. ANTERIOR CIRCULATION: --Intracranial internal carotid arteries: Normal. --Anterior cerebral arteries (ACA): Normal. --Middle cerebral arteries (MCA): Normal. ANATOMIC VARIANTS: None IMPRESSION:  1. Small focus of acute ischemia within the inferior right pons. No hemorrhage or mass effect.  2. Advanced atrophy and chronic small vessel disease.  3. Normal intracranial MRA.  Electronically Signed   By: Deatra Robinson M.D.   On: 06/05/2021 23:16     Transthoracic Echocardiogram -pending  ECG - SR rate 77 BPM. (See cardiology reading for complete details)   PHYSICAL EXAM  Temp:  [98 F (36.7 C)-98.2 F (36.8 C)] 98 F (36.7 C) (09/03 1345) Pulse  Rate:  [76-89] 85 (09/03 1345) Resp:  [14-22] 19 (09/03 1345) BP: (131-173)/(67-98) 161/85 (09/03 1345) SpO2:  [93 %-98 %] 96 % (09/03 1345)  General - Well nourished, well developed, in no apparent  distress.  Ophthalmologic - fundi not visualized due to noncooperation.  Cardiovascular - Regular rhythm and rate.  Neuro - awake, alert, eyes open, orientated to age, place, time and people. No aphasia, following simple commands. No gaze palsy, tracking bilaterally, visual field full, PERRL. Left mild facial droop which is chronic per pt. Tongue midline. LUE 5/5 and RUE mild pronator drift which is chronic per daughter. LLE 4/5 proximal and distal and RLE proximal 2+/5 and 3/5 distally which are chronic per daughter, b/l FTN intact, right facial decreased light touch sensation, gait not tested.     ASSESSMENT/PLAN Ms. Shannon Bender is a 65 y.o. female with history of hypertension, diabetes, rheumatoid arthritis on Enbrel, history of stroke about 14 years ago with very mild residual right-sided weakness who presents with right facial numbness. She did not receive tPA due to late presentation (>4.5 hours from time of onset).  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 MRI head - Small focus of acute ischemia within the inferior right pons. No hemorrhage or mass effect. Advanced atrophy and chronic small vessel disease.  MRA head - normal MRA Neck - 50% stenosis of the proximal right internal carotid artery. No left carotid or vertebral artery stenosis.  2D Echo - planned as outpatient study as patient family asking to be discharged prior to completion of 2D echo [employer] Virus 2 - negative LDL - 76 HgbA1c - 7.8 UDS - negative VTE prophylaxis - Lovenox No antithrombotic prior to admission, now on aspirin 81 mg daily and clopidogrel 75 mg daily DAPT for 3 weeks and then aspirin alone. Patient will be counseled to be compliant with her antithrombotic medications Ongoing aggressive stroke risk factor management Therapy recommendations:  HH PT and OT Disposition: Home  today  Hypertension Home BP meds: Norvasc , Cozaar Current BP meds: none  Stable Gradually normalize in 2-3 days  Long-term BP goal normotensive  Hyperlipidemia Home Lipid lowering medication: none  LDL 76, goal < 70 Current lipid lowering medication: Lipitor 40 mg daily  Continue statin at discharge  Diabetes, uncontrolled Home diabetic meds: glucophage, Januvia, Invokana HgbA1c 7.8, goal < 7.0 SSI CBG monitoring Close PCP follow-up for better DM control  Other Stroke Risk Factors Advanced age Hx stroke 14 years ago with mild residual right-sided weakness  Other Active Problems, Findings, Recommendations and/or Plan Code status - Full code  Thrombocytopenia - platelets - 94 K Rheumatoid arthritis on Enbrel   Hospital day # 0  Neurology will sign off. Please call with questions. Pt will follow up with stroke clinic NP at [facility] in about 4 weeks. Thanks for the consult.  Marvel Plan, MD PhD Stroke Neurology 06/06/2021 5:41 PM   To contact Stroke Continuity provider, please refer to [email or web address]. After hours, contact General Neurology

## 2021-06-06 NOTE — Progress Notes (Signed)
Occupational Therapy Evaluation and Discharge Patient Details Name: Shannon Bender MRN: [id number] DOB: Dec 20, 1955 Today's Date: 06/06/2021    History of Present Illness Pt is a 65 y.o. female who presented 06/05/21 with R facial numbness. No tPA administered as pt was outside window. Imaging revealed small focus of acute ischemia within the inferior right pons and approximately 50% stenosis of the proximal right internal carotid artery. 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  She currently requires set up assist - mod A for UB ADLs and mod - max A for LB ADLs; min A for functional mobility.  Pt lives with her spouse and daughter and son in law who provide 24 hour assist.  She has a long history of falls at home.  Recommend HHOT if available, and if not available, recommend OPOT.   All further needs can be addressed by OT in next venue of care.  Acute OT will sign off.     Follow Up Recommendations  Home health OT;Supervision/Assistance - 24 hour (OPOT if HHOT not available)    Equipment Recommendations  None recommended by OT    Recommendations for Other Services       Precautions / Restrictions Precautions Precautions: Fall Precaution Comments: daughter reports h/o frequent falls ~ 1/week      Mobility Bed Mobility Overal bed mobility: Needs Assistance Bed Mobility: Supine to Sit;Sit to Supine     Supine to sit: Mod assist Sit to supine: Mod assist   General bed mobility comments: assist to lift trunk from hospital stretcher and assist to lift LEs back onto stretcher    Transfers                      Balance Overall balance assessment: Needs assistance Sitting-balance support: Feet supported Sitting balance-Leahy Scale: Fair Sitting balance -  Comments: able to maintain static sitting with supervision Postural control: Left lateral lean Standing balance support: Bilateral upper extremity supported Standing balance-Leahy Scale: Poor Standing balance comment: requires UE support and has h/o falls                           ADL either performed or assessed with clinical judgement   ADL Overall ADL's : Needs assistance/impaired Eating/Feeding: Bed level;Sitting;NPO (pt able to perform simulated feeding with set up assist)   Grooming: Wash/dry hands;Wash/dry face;Oral care;Minimal assistance;Sitting   Upper Body Bathing: Minimal assistance;Sitting   Lower Body Bathing: Moderate assistance;Sit to/from stand   Upper Body Dressing : Minimal assistance;Sitting   Lower Body Dressing: Maximal assistance;Sit to/from stand   Toilet Transfer: Minimal assistance;Ambulation;Comfort height toilet;Grab bars;RW   Toileting- Clothing Manipulation and Hygiene: Moderate assistance;Sit to/from stand       Functional mobility during ADLs: Minimal assistance;Rolling walker General ADL Comments: requires cues for walker safety.  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  Daughter reports pt with long standing weakness Rt UE from previous stroke, but that it is currently worse than it was at baseline. Pt denies numbness or tingling RUE Coordination: decreased gross motor;decreased fine motor   Lower Extremity  Assessment Lower Extremity Assessment: Defer to PT evaluation   Cervical / Trunk Assessment Cervical / Trunk Assessment: Kyphotic;Other exceptions Cervical / Trunk Exceptions: Pt demontrates Lt lateral lean with Rt lateral trunk flexion and elongation of Lt trunk   Communication Communication Communication: Prefers language other than English   Cognition Arousal/Alertness: Awake/alert Behavior During Therapy: WFL for tasks assessed/performed Overall Cognitive Status: Difficult to assess                                     General Comments  VSS    Exercises     Shoulder Instructions      Home Living Family/patient expects to be discharged to:: Private residence Living Arrangements: Spouse/significant other;Children Available Help at Discharge: Family;Available 24 hours/day Type of Home: Other(Comment) (townhouse) Home Access: Level entry     Home Layout: Two level;Able to live on main level with bedroom/bathroom Alternate Level Stairs-Number of Steps: 12 Alternate Level Stairs-Rails: Right Bathroom Shower/Tub: Producer, television/film/video: Standard     Home Equipment: Environmental consultant - 4 wheels;Bedside commode;Shower seat   Additional Comments: Pt and spouse live with their daughter and son in law.  they provide 24 hour assist      Prior Functioning/Environment Level of Independence: Needs assistance  Gait / Transfers Assistance Needed: pt ambulates with rollator, but daughter reports pt falls ~1x/week ADL's / Homemaking Assistance Needed: Daughter assists with LB ADLs, and assists pt with shower transfers.  Pt incontinent of urine at night   Comments: pt requested that daughter translate.  Daughter present throughout session        OT Problem List: Decreased strength;Decreased activity tolerance;Impaired balance (sitting and/or standing);Decreased coordination;Decreased knowledge of use of DME or AE;Decreased safety awareness;Impaired UE functional  use      OT Treatment/Interventions:      OT Goals(Current goals can be found in the care plan section) Acute Rehab OT Goals Patient Stated Goal: to go home soon OT Goal Formulation: All assessment and education complete, DC therapy  OT Frequency:     Barriers to D/C:            Co-evaluation PT/OT/SLP Co-Evaluation/Treatment: Yes Reason for Co-Treatment: To address functional/ADL transfers   OT goals addressed during session: ADL's and self-care      AM-PAC OT "6 Clicks" Daily Activity     Outcome Measure Help from another person eating meals?: A Little Help from another person taking care of personal grooming?: A Little Help from another person toileting, which includes using toliet, bedpan, or urinal?: A Lot Help from another person bathing (including washing, rinsing, drying)?: A Lot Help from another person to put on and taking off regular upper body clothing?: A Little Help from another person to put on and taking off regular lower body clothing?: A Lot 6 Click Score: 15   End of Session Equipment Utilized During Treatment: Rolling walker;Gait belt Nurse Communication: Mobility status  Activity Tolerance: Patient tolerated treatment well Patient left: in bed;with call bell/phone within reach;with family/visitor present  OT Visit Diagnosis: Unsteadiness on feet (R26.81)  Time: 4098-1191 OT Time Calculation (min): 16 min Charges:  OT General Charges $OT Visit: 1 Visit OT Evaluation $OT Eval Moderate Complexity: 1 Mod  Eber Jones., OTR/L Acute Rehabilitation Services Pager [phone] Office [phone]   Jeani Hawking M 06/06/2021, 9:17 AM

## 2021-06-06 NOTE — Care Management (Signed)
[facility] now not able to accept, as well as [facility].

## 2021-06-06 NOTE — Discharge Summary (Signed)
PATIENT DETAILS Name: Shannon Bender Age: 65 y.o. Sex: female Date of Birth: 1956-06-10 MRN: [id number]. Admitting Physician: Charlsie Quest, MD WUJ:[id number], Rudy Jew, PA-C  Admit Date: 06/05/2021 Discharge date: 06/06/2021  Recommendations for Outpatient Follow-up:  Follow up with PCP in 1-2 weeks Please obtain CMP/CBC in one week Please arrange for outpatient echocardiogram to complete stroke work-up Urine culture pending-please follow  Admitted From:  Home  Disposition: Home with home health services   Home Health: Yes  Equipment/Devices: None  Discharge Condition: Stable  CODE STATUS: FULL CODE  Diet recommendation:  Diet Order             Diet Heart Room service appropriate? Yes; Fluid consistency: Thin  Diet effective now           Diet - low sodium heart healthy                    Brief Summary: 65 year old with prior history of CVA, RA on etanercept, HTN, DM-2, chronic thrombocytopenia-who presented with right facial numbness-found to have acute CVA and subsequently admitted to the hospitalist service.  See below for further details.  Significant studies: 9/2>> MRI brain: Acute infarct in the right inferior pons. 9/2>> MRA brain: No significant stenosis 9/2>> MRA neck: 50% stenosis of proximal right ICA.  No left carotid or vertebral artery stenosis. 9/3>> LDL: 76 9/3>> A1c 7.8 9/3>> Echo ordered-but patient/family wanted to pursue in the outpatient setting  Brief Hospital Course: Acute CVA involving the right inferior pons: No significant weakness evident on exam-right facial numbness has significantly improved.  Ideally needs echo to be done before discharge-however both patient and family are just tired of waiting in the ED and really want to go home, and want to pursue echocardiogram in the outpatient setting.  I have asked the patient's daughter to call the PCPs office and get the echo arranged.  Recommendations from neurology for  aspirin/Plavix x3 weeks followed by aspirin alone.  She will be continued on statin on discharge.  DM-2: Per patient's daughter-insulin/oral regimen was just changed a few days prior to this hospitalization by patient's PCP.  Will refrain from further adjustment-patient to resume her usual regimen and follow with PCP.  Rest of medical problems were stable during his short overnight hospital stay   Procedures None  Discharge Diagnoses:  Principal Problem:   Acute CVA (cerebrovascular accident) (HCC) Active Problems:   Insulin dependent type 2 diabetes mellitus (HCC)   Hypertension associated with diabetes (HCC)   Thrombocytopenia (HCC)   Discharge Instructions:  Activity:  As tolerated with Full fall precautions use walker/cane & assistance as needed   Discharge Instructions     Ambulatory referral to Neurology   Complete by: As directed    An appointment is requested in approximately: 8 weeks   Call MD for:  extreme fatigue   Complete by: As directed    Call MD for:  persistant dizziness or light-headedness   Complete by: As directed    Diet - low sodium heart healthy   Complete by: As directed    Discharge instructions   Complete by: As directed    1.)  Aspirin/Plavix x3 weeks followed by aspirin alone  2.)  Please contact your primary care practitioner early next week to see if a outpatient echocardiogram can be arranged to complete your stroke work-up.   Follow with Primary MD  Bailey Mech, PA-C in 1-2 weeks  Please get a complete blood count and chemistry  panel checked by your Primary MD at your next visit, and again as instructed by your Primary MD.  Get Medicines reviewed and adjusted: Please take all your medications with you for your next visit with your Primary MD  Laboratory/radiological data: Please request your Primary MD to go over all hospital tests and procedure/radiological results at the follow up, please ask your Primary MD to get all  Hospital records sent to his/her office.  In some cases, they will be blood work, cultures and biopsy results pending at the time of your discharge. Please request that your primary care M.D. follows up on these results.  Also Note the following: If you experience worsening of your admission symptoms, develop shortness of breath, life threatening emergency, suicidal or homicidal thoughts you must seek medical attention immediately by calling 911 or calling your MD immediately  if symptoms less severe.  You must read complete instructions/literature along with all the possible adverse reactions/side effects for all the Medicines you take and that have been prescribed to you. Take any new Medicines after you have completely understood and accpet all the possible adverse reactions/side effects.   Do not drive when taking Pain medications or sleeping medications (Benzodaizepines)  Do not take more than prescribed Pain, Sleep and Anxiety Medications. It is not advisable to combine anxiety,sleep and pain medications without talking with your primary care practitioner  Special Instructions: If you have smoked or chewed Tobacco  in the last 2 yrs please stop smoking, stop any regular Alcohol  and or any Recreational drug use.  Wear Seat belts while driving.  Please note: You were cared for by a hospitalist during your hospital stay. Once you are discharged, your primary care physician will handle any further medical issues. Please note that NO REFILLS for any discharge medications will be authorized once you are discharged, as it is imperative that you return to your primary care physician (or establish a relationship with a primary care physician if you do not have one) for your post hospital discharge needs so that they can reassess your need for medications and monitor your lab values.   Increase activity slowly   Complete by: As directed       Allergies as of 06/06/2021   No Known Allergies       Medication List     TAKE these medications    allopurinol 300 MG tablet Commonly known as: ZYLOPRIM Take 300 mg by mouth daily.   amLODipine 5 MG tablet Commonly known as: NORVASC Take 5 mg by mouth daily.   aspirin 81 MG EC tablet Take 1 tablet (81 mg total) by mouth daily. Swallow whole. Start taking on: June 07, 2021   atorvastatin 40 MG tablet Commonly known as: LIPITOR Take 1 tablet (40 mg total) by mouth daily. Start taking on: June 07, 2021   canagliflozin 100 MG Tabs tablet Commonly known as: INVOKANA Take 100 mg by mouth daily before breakfast.   cetirizine 10 MG tablet Commonly known as: ZYRTEC Take 10 mg by mouth daily as needed for allergies.   clopidogrel 75 MG tablet Commonly known as: PLAVIX Take 1 tablet (75 mg total) by mouth daily. Start taking on: June 07, 2021   Enbrel Mini 50 MG/ML Soct Generic drug: Etanercept Inject 50 mg into the skin once a week. Sundays   escitalopram 10 MG tablet Commonly known as: LEXAPRO Take 10 mg by mouth daily.   folic acid 1 MG tablet Commonly known as: FOLVITE Take 1 mg  by mouth daily.   ibuprofen 200 MG tablet Commonly known as: ADVIL Take 400 mg by mouth every 6 (six) hours as needed for mild pain.   insulin NPH-regular Human (70-30) 100 UNIT/ML injection Inject 10-20 Units into the skin 2 (two) times daily. Takes 20 units in the morning and 10 units in the evening   losartan 25 MG tablet Commonly known as: COZAAR Take 25 mg by mouth daily.   metFORMIN 850 MG tablet Commonly known as: GLUCOPHAGE Take 850 mg by mouth 2 (two) times daily with a meal.   pantoprazole 40 MG tablet Commonly known as: Protonix Take 1 tablet (40 mg total) by mouth daily.   sitaGLIPtin 50 MG tablet Commonly known as: JANUVIA Take 50 mg by mouth daily.   triamcinolone cream 0.5 % Commonly known as: KENALOG Apply 1 application topically daily as needed (itching).        Follow-up Information      Bailey Mechodraza, Cole Christopher, PA-C. Schedule an appointment as soon as possible for a visit in 1 week(s).   Specialty: Physician Assistant Contact information: [address] [place] [address] [phone]         [facility] Follow up.   Why: Hospital follow up, Office will call with date/time, If you dont hear from them,please give them a call Contact information: [address]     [address] [place] [phone]               No Known Allergies    Consultations:  Neuro   Other Procedures/Studies: CT HEAD WO CONTRAST  Result Date: 06/05/2021 CLINICAL DATA:  Neuro deficit, acute stroke suspected. Head and facial numbness since 4 a.m. (11 hours prior). EXAM: CT HEAD WITHOUT CONTRAST TECHNIQUE: Contiguous axial images were obtained from the base of the skull through the vertex without intravenous contrast. COMPARISON:  None. FINDINGS: Brain: There is no evidence of acute intracranial hemorrhage, mass lesion, brain edema or extra-axial fluid collection. There is mild atrophy with prominence of the ventricles and subarachnoid spaces. Patchy low-density in the periventricular white matter is likely related to chronic small vessel ischemic changes. There is a small lacune within the right caudate body. There is no CT evidence of acute cortical infarction. Vascular: Prominent intracranial vascular calcifications. No hyperdense vessel identified. Skull: Negative for fracture or focal lesion. Sinuses/Orbits: The visualized paranasal sinuses and mastoid air cells are clear. No orbital abnormalities are seen. Other: None. IMPRESSION: 1. Age indeterminate periventricular white matter disease, likely due to chronic small vessel ischemic changes. 2. No CT evidence of acute cortical infarct or acute intracranial hemorrhage. Electronically Signed   By: Carey BullocksWilliam  Veazey M.D.   On: 06/05/2021 17:37   MR ANGIO HEAD WO CONTRAST  Result  Date: 06/05/2021 CLINICAL DATA:  Acute neurologic deficit EXAM: MRI HEAD WITHOUT CONTRAST MRA HEAD WITHOUT CONTRAST TECHNIQUE: Multiplanar, multi-echo pulse sequences of the brain and surrounding structures were acquired without intravenous contrast. Angiographic images of the Circle of Willis were acquired using MRA technique without intravenous contrast. COMPARISON:  No pertinent prior exam. FINDINGS: MRI HEAD FINDINGS Brain: Small focus of abnormal diffusion restriction within the inferior right pons. Chronic microhemorrhage in the right temporal lobe. Hyperintense T2-weighted signal is moderately widespread throughout the white matter. Advanced atrophy for age. There are old bilateral cerebellar small vessel infarcts. The midline structures are normal. Vascular: Major flow voids are preserved. Skull and upper cervical spine: Normal calvarium and skull base. Visualized upper cervical  spine and soft tissues are normal. Sinuses/Orbits:No paranasal sinus fluid levels or advanced mucosal thickening. No mastoid or middle ear effusion. Normal orbits. MRA HEAD FINDINGS POSTERIOR CIRCULATION: --Vertebral arteries: Normal --Inferior cerebellar arteries: Normal. --Basilar artery: Normal. --Superior cerebellar arteries: Normal. --Posterior cerebral arteries: Narrowing of the proximal right P2 segment. Otherwise normal. ANTERIOR CIRCULATION: --Intracranial internal carotid arteries: Normal. --Anterior cerebral arteries (ACA): Normal. --Middle cerebral arteries (MCA): Normal. ANATOMIC VARIANTS: None IMPRESSION: 1. Small focus of acute ischemia within the inferior right pons. No hemorrhage or mass effect. 2. Advanced atrophy and chronic small vessel disease. 3. Normal intracranial MRA. Electronically Signed   By: Deatra Robinson M.D.   On: 06/05/2021 23:16   MR Angiogram Neck W or Wo Contrast  Result Date: 06/05/2021 CLINICAL DATA:  Acute neurologic deficit EXAM: MRA NECK WITHOUT AND WITH CONTRAST TECHNIQUE: Multiplanar and  multiecho pulse sequences of the neck were obtained without and with intravenous contrast. Angiographic images of the neck were obtained using MRA technique without and with intravenous contrast. CONTRAST:  6.57mL GADAVIST GADOBUTROL 1 MMOL/ML IV SOLN COMPARISON:  None. FINDINGS: There is approximately 50% stenosis of the proximal right internal carotid artery. The left carotid system and both vertebral arteries are normal. Three-vessel branching pattern of the aortic arch. IMPRESSION: 1. Approximately 50% stenosis of the proximal right internal carotid artery. 2. No left carotid or vertebral artery stenosis. Electronically Signed   By: Deatra Robinson M.D.   On: 06/05/2021 23:46   MR BRAIN WO CONTRAST  Result Date: 06/05/2021 CLINICAL DATA:  Acute neurologic deficit EXAM: MRI HEAD WITHOUT CONTRAST MRA HEAD WITHOUT CONTRAST TECHNIQUE: Multiplanar, multi-echo pulse sequences of the brain and surrounding structures were acquired without intravenous contrast. Angiographic images of the Circle of Willis were acquired using MRA technique without intravenous contrast. COMPARISON:  No pertinent prior exam. FINDINGS: MRI HEAD FINDINGS Brain: Small focus of abnormal diffusion restriction within the inferior right pons. Chronic microhemorrhage in the right temporal lobe. Hyperintense T2-weighted signal is moderately widespread throughout the white matter. Advanced atrophy for age. There are old bilateral cerebellar small vessel infarcts. The midline structures are normal. Vascular: Major flow voids are preserved. Skull and upper cervical spine: Normal calvarium and skull base. Visualized upper cervical spine and soft tissues are normal. Sinuses/Orbits:No paranasal sinus fluid levels or advanced mucosal thickening. No mastoid or middle ear effusion. Normal orbits. MRA HEAD FINDINGS POSTERIOR CIRCULATION: --Vertebral arteries: Normal --Inferior cerebellar arteries: Normal. --Basilar artery: Normal. --Superior cerebellar  arteries: Normal. --Posterior cerebral arteries: Narrowing of the proximal right P2 segment. Otherwise normal. ANTERIOR CIRCULATION: --Intracranial internal carotid arteries: Normal. --Anterior cerebral arteries (ACA): Normal. --Middle cerebral arteries (MCA): Normal. ANATOMIC VARIANTS: None IMPRESSION: 1. Small focus of acute ischemia within the inferior right pons. No hemorrhage or mass effect. 2. Advanced atrophy and chronic small vessel disease. 3. Normal intracranial MRA. Electronically Signed   By: Deatra Robinson M.D.   On: 06/05/2021 23:16     TODAY-DAY OF DISCHARGE:  Subjective:   Shannon Bender today has no headache,no chest abdominal pain,no new weakness tingling or numbness, feels much better wants to go home today.   Objective:   Blood pressure (!) 153/67, pulse 89, temperature 98.2 F (36.8 C), temperature source Oral, resp. rate 19, height 5\' 1"  (1.549 m), weight 68.3 kg, SpO2 94 %. No intake or output data in the 24 hours ending 06/06/21 1334 Filed Weights   06/05/21 1505  Weight: 68.3 kg    Exam: Awake Alert, Oriented *3, No new F.N deficits,  Normal affect [place].AT,PERRAL Supple Neck,No JVD, No cervical lymphadenopathy appriciated.  Symmetrical Chest wall movement, Good air movement bilaterally, CTAB RRR,No Gallops,Rubs or new Murmurs, No Parasternal Heave +ve B.Sounds, Abd Soft, Non tender, No organomegaly appriciated, No rebound -guarding or rigidity. No Cyanosis, Clubbing or edema, No new Rash or bruise   PERTINENT RADIOLOGIC STUDIES: CT HEAD WO CONTRAST  Result Date: 06/05/2021 CLINICAL DATA:  Neuro deficit, acute stroke suspected. Head and facial numbness since 4 a.m. (11 hours prior). EXAM: CT HEAD WITHOUT CONTRAST TECHNIQUE: Contiguous axial images were obtained from the base of the skull through the vertex without intravenous contrast. COMPARISON:  None. FINDINGS: Brain: There is no evidence of acute intracranial hemorrhage, mass lesion, brain edema or extra-axial  fluid collection. There is mild atrophy with prominence of the ventricles and subarachnoid spaces. Patchy low-density in the periventricular white matter is likely related to chronic small vessel ischemic changes. There is a small lacune within the right caudate body. There is no CT evidence of acute cortical infarction. Vascular: Prominent intracranial vascular calcifications. No hyperdense vessel identified. Skull: Negative for fracture or focal lesion. Sinuses/Orbits: The visualized paranasal sinuses and mastoid air cells are clear. No orbital abnormalities are seen. Other: None. IMPRESSION: 1. Age indeterminate periventricular white matter disease, likely due to chronic small vessel ischemic changes. 2. No CT evidence of acute cortical infarct or acute intracranial hemorrhage. Electronically Signed   By: Carey Bullocks M.D.   On: 06/05/2021 17:37   MR ANGIO HEAD WO CONTRAST  Result Date: 06/05/2021 CLINICAL DATA:  Acute neurologic deficit EXAM: MRI HEAD WITHOUT CONTRAST MRA HEAD WITHOUT CONTRAST TECHNIQUE: Multiplanar, multi-echo pulse sequences of the brain and surrounding structures were acquired without intravenous contrast. Angiographic images of the Circle of Willis were acquired using MRA technique without intravenous contrast. COMPARISON:  No pertinent prior exam. FINDINGS: MRI HEAD FINDINGS Brain: Small focus of abnormal diffusion restriction within the inferior right pons. Chronic microhemorrhage in the right temporal lobe. Hyperintense T2-weighted signal is moderately widespread throughout the white matter. Advanced atrophy for age. There are old bilateral cerebellar small vessel infarcts. The midline structures are normal. Vascular: Major flow voids are preserved. Skull and upper cervical spine: Normal calvarium and skull base. Visualized upper cervical spine and soft tissues are normal. Sinuses/Orbits:No paranasal sinus fluid levels or advanced mucosal thickening. No mastoid or middle ear  effusion. Normal orbits. MRA HEAD FINDINGS POSTERIOR CIRCULATION: --Vertebral arteries: Normal --Inferior cerebellar arteries: Normal. --Basilar artery: Normal. --Superior cerebellar arteries: Normal. --Posterior cerebral arteries: Narrowing of the proximal right P2 segment. Otherwise normal. ANTERIOR CIRCULATION: --Intracranial internal carotid arteries: Normal. --Anterior cerebral arteries (ACA): Normal. --Middle cerebral arteries (MCA): Normal. ANATOMIC VARIANTS: None IMPRESSION: 1. Small focus of acute ischemia within the inferior right pons. No hemorrhage or mass effect. 2. Advanced atrophy and chronic small vessel disease. 3. Normal intracranial MRA. Electronically Signed   By: Deatra Robinson M.D.   On: 06/05/2021 23:16   MR Angiogram Neck W or Wo Contrast  Result Date: 06/05/2021 CLINICAL DATA:  Acute neurologic deficit EXAM: MRA NECK WITHOUT AND WITH CONTRAST TECHNIQUE: Multiplanar and multiecho pulse sequences of the neck were obtained without and with intravenous contrast. Angiographic images of the neck were obtained using MRA technique without and with intravenous contrast. CONTRAST:  6.62mL GADAVIST GADOBUTROL 1 MMOL/ML IV SOLN COMPARISON:  None. FINDINGS: There is approximately 50% stenosis of the proximal right internal carotid artery. The left carotid system and both vertebral arteries are normal. Three-vessel branching pattern of the aortic arch.  IMPRESSION: 1. Approximately 50% stenosis of the proximal right internal carotid artery. 2. No left carotid or vertebral artery stenosis. Electronically Signed   By: Deatra Robinson M.D.   On: 06/05/2021 23:46   MR BRAIN WO CONTRAST  Result Date: 06/05/2021 CLINICAL DATA:  Acute neurologic deficit EXAM: MRI HEAD WITHOUT CONTRAST MRA HEAD WITHOUT CONTRAST TECHNIQUE: Multiplanar, multi-echo pulse sequences of the brain and surrounding structures were acquired without intravenous contrast. Angiographic images of the Circle of Willis were acquired using MRA  technique without intravenous contrast. COMPARISON:  No pertinent prior exam. FINDINGS: MRI HEAD FINDINGS Brain: Small focus of abnormal diffusion restriction within the inferior right pons. Chronic microhemorrhage in the right temporal lobe. Hyperintense T2-weighted signal is moderately widespread throughout the white matter. Advanced atrophy for age. There are old bilateral cerebellar small vessel infarcts. The midline structures are normal. Vascular: Major flow voids are preserved. Skull and upper cervical spine: Normal calvarium and skull base. Visualized upper cervical spine and soft tissues are normal. Sinuses/Orbits:No paranasal sinus fluid levels or advanced mucosal thickening. No mastoid or middle ear effusion. Normal orbits. MRA HEAD FINDINGS POSTERIOR CIRCULATION: --Vertebral arteries: Normal --Inferior cerebellar arteries: Normal. --Basilar artery: Normal. --Superior cerebellar arteries: Normal. --Posterior cerebral arteries: Narrowing of the proximal right P2 segment. Otherwise normal. ANTERIOR CIRCULATION: --Intracranial internal carotid arteries: Normal. --Anterior cerebral arteries (ACA): Normal. --Middle cerebral arteries (MCA): Normal. ANATOMIC VARIANTS: None IMPRESSION: 1. Small focus of acute ischemia within the inferior right pons. No hemorrhage or mass effect. 2. Advanced atrophy and chronic small vessel disease. 3. Normal intracranial MRA. Electronically Signed   By: Deatra Robinson M.D.   On: 06/05/2021 23:16     PERTINENT LAB RESULTS: CBC: Recent Labs    06/05/21 1600  WBC 8.4  HGB 15.5*  HCT 44.4  PLT 94*   CMET CMP     Component Value Date/Time   NA 140 06/05/2021 1600   K 4.0 06/05/2021 1600   CL 101 06/05/2021 1600   CO2 27 06/05/2021 1600   GLUCOSE 182 (H) 06/05/2021 1600   BUN 17 06/05/2021 1600   CREATININE 0.97 06/05/2021 1600   CALCIUM 10.1 06/05/2021 1600   PROT 7.5 06/05/2021 1600   ALBUMIN 4.4 06/05/2021 1600   AST 22 06/05/2021 1600   ALT 17 06/05/2021  1600   ALKPHOS 59 06/05/2021 1600   BILITOT 0.5 06/05/2021 1600   GFRNONAA >60 06/05/2021 1600    GFR Estimated Creatinine Clearance: 51.8 mL/min (by C-G formula based on SCr of 0.97 mg/dL). No results for input(s): LIPASE, AMYLASE in the last 72 hours. No results for input(s): CKTOTAL, CKMB, CKMBINDEX, TROPONINI in the last 72 hours. Invalid input(s): POCBNP No results for input(s): DDIMER in the last 72 hours. Recent Labs    06/06/21 0300  HGBA1C 7.8*   Recent Labs    06/06/21 0301  CHOL 159  HDL 38*  LDLCALC 76  TRIG 161*  CHOLHDL 4.2   No results for input(s): TSH, T4TOTAL, T3FREE, THYROIDAB in the last 72 hours.  Invalid input(s): FREET3 No results for input(s): VITAMINB12, FOLATE, FERRITIN, TIBC, IRON, RETICCTPCT in the last 72 hours. Coags: Recent Labs    06/05/21 1600  INR 1.0   Microbiology: Recent Results (from the past 240 hour(s))  Resp Panel by RT-PCR (Flu A&B, Covid) Nasopharyngeal Swab     Status: None   Collection Time: 06/05/21  4:00 PM   Specimen: Nasopharyngeal Swab; Nasopharyngeal(NP) swabs in vial transport medium  Result Value Ref Range Status  SARS Coronavirus 2 by RT PCR NEGATIVE NEGATIVE Final    Comment: (NOTE) SARS-CoV-2 target nucleic acids are NOT DETECTED.  The SARS-CoV-2 RNA is generally detectable in upper respiratory specimens during the acute phase of infection. The lowest concentration of SARS-CoV-2 viral copies this assay can detect is 138 copies/mL. A negative result does not preclude SARS-Cov-2 infection and should not be used as the sole basis for treatment or other patient management decisions. A negative result may occur with  improper specimen collection/handling, submission of specimen other than nasopharyngeal swab, presence of viral mutation(s) within the areas targeted by this assay, and inadequate number of viral copies(<138 copies/mL). A negative result must be combined with clinical observations, patient  history, and epidemiological information. The expected result is Negative.  Fact Sheet for Patients:  BloggerCourse.com  Fact Sheet for Healthcare Providers:  SeriousBroker.it  This test is no t yet approved or cleared by the [place] FDA and  has been authorized for detection and/or diagnosis of SARS-CoV-2 by FDA under an Emergency Use Authorization (EUA). This EUA will remain  in effect (meaning this test can be used) for the duration of the COVID-19 declaration under Section 564(b)(1) of the Act, 21 U.S.C.section 360bbb-3(b)(1), unless the authorization is terminated  or revoked sooner.       Influenza A by PCR NEGATIVE NEGATIVE Final   Influenza B by PCR NEGATIVE NEGATIVE Final    Comment: (NOTE) The Xpert Xpress SARS-CoV-2/FLU/RSV plus assay is intended as an aid in the diagnosis of influenza from Nasopharyngeal swab specimens and should not be used as a sole basis for treatment. Nasal washings and aspirates are unacceptable for Xpert Xpress SARS-CoV-2/FLU/RSV testing.  Fact Sheet for Patients: BloggerCourse.com  Fact Sheet for Healthcare Providers: SeriousBroker.it  This test is not yet approved or cleared by the [place] FDA and has been authorized for detection and/or diagnosis of SARS-CoV-2 by FDA under an Emergency Use Authorization (EUA). This EUA will remain in effect (meaning this test can be used) for the duration of the COVID-19 declaration under Section 564(b)(1) of the Act, 21 U.S.C. section 360bbb-3(b)(1), unless the authorization is terminated or revoked.  Performed at [facility], [address] [place], [place] [address]     FURTHER DISCHARGE INSTRUCTIONS:  Get Medicines reviewed and adjusted: Please take all your medications with you for your next visit with your Primary MD  Laboratory/radiological data: Please request your  Primary MD to go over all hospital tests and procedure/radiological results at the follow up, please ask your Primary MD to get all Hospital records sent to his/her office.  In some cases, they will be blood work, cultures and biopsy results pending at the time of your discharge. Please request that your primary care M.D. goes through all the records of your hospital data and follows up on these results.  Also Note the following: If you experience worsening of your admission symptoms, develop shortness of breath, life threatening emergency, suicidal or homicidal thoughts you must seek medical attention immediately by calling 911 or calling your MD immediately  if symptoms less severe.  You must read complete instructions/literature along with all the possible adverse reactions/side effects for all the Medicines you take and that have been prescribed to you. Take any new Medicines after you have completely understood and accpet all the possible adverse reactions/side effects.   Do not drive when taking Pain medications or sleeping medications (Benzodaizepines)  Do not take more than prescribed Pain, Sleep and Anxiety  Medications. It is not advisable to combine anxiety,sleep and pain medications without talking with your primary care practitioner  Special Instructions: If you have smoked or chewed Tobacco  in the last 2 yrs please stop smoking, stop any regular Alcohol  and or any Recreational drug use.  Wear Seat belts while driving.  Please note: You were cared for by a hospitalist during your hospital stay. Once you are discharged, your primary care physician will handle any further medical issues. Please note that NO REFILLS for any discharge medications will be authorized once you are discharged, as it is imperative that you return to your primary care physician (or establish a relationship with a primary care physician if you do not have one) for your post hospital discharge needs so that they  can reassess your need for medications and monitor your lab values.  Total Time spent coordinating discharge including counseling, education and face to face time equals 35 minutes.  SignedJeoffrey Massed 06/06/2021 1:34 PM

## 2021-06-06 NOTE — ED Notes (Signed)
Pt's daughter notified this RN that pt is tired of waiting and is requesting to be discharged and get her echo done outpatient. MD notified.

## 2021-06-06 NOTE — ED Notes (Signed)
PT at bedside.

## 2021-06-06 NOTE — Evaluation (Signed)
Physical Therapy Evaluation Patient Details Name: Shannon Bender MRN: [id number] DOB: Jun 28, 1956 Today's Date: 06/06/2021   History of Present Illness  Pt is a 65 y.o. female who presented 06/05/21 with R facial numbness. No tPA administered as pt was outside window. Imaging revealed small focus of acute ischemia within the inferior right pons and approximately 50% stenosis of the proximal right internal carotid artery. PMH significant for hypertension, diabetes, rheumatoid arthritis on Enbrel, history of stroke about 14 years ago with very mild residual right-sided weakness   Clinical Impression  Pt presents with condition above and deficits mentioned below, see PT Problem List. Pt lives with her spouse and daughter and son in law who provide 24 hour assist.  She has a long history of falls at home. Pt uses a rollator for mod I mobility but needs assistance to navigate the stairs. Pt currently displays deficits in coordination, balance, activity tolerance, and R sided strength that place her at high risk for falls. She is needing up to [facility] for bed mobility and min guard assist for safety with transfers and gait with a RW. Educated daughter to continue guarding pt on stairs. Recommend HHPT if available, and if not available, recommend neuro Outpatient PT. Will continue to follow acutely.    Follow Up Recommendations Home health PT;Supervision/Assistance - 24 hour (Neuro Outpatient PT if HHPT unavailable)    Equipment Recommendations  None recommended by PT    Recommendations for Other Services       Precautions / Restrictions Precautions Precautions: Fall Precaution Comments: daughter reports h/o frequent falls ~ 1/week Restrictions Weight Bearing Restrictions: No      Mobility  Bed Mobility Overal bed mobility: Needs Assistance Bed Mobility: Supine to Sit;Sit to Supine     Supine to sit: Mod assist Sit to supine: Mod assist   General bed mobility comments: assist to lift trunk from  hospital stretcher and assist to lift LEs back onto stretcher    Transfers Overall transfer level: Needs assistance Equipment used: Rolling walker (2 wheeled) Transfers: Sit to/from Stand Sit to Stand: Min guard         General transfer comment: Min guard assist to stand from stretcher to RW for safety.  Ambulation/Gait Ambulation/Gait assistance: Min guard Gait Distance (Feet): 100 Feet Assistive device: Rolling walker (2 wheeled) Gait Pattern/deviations: Step-to pattern;Decreased step length - right;Decreased dorsiflexion - right Gait velocity: reduced Gait velocity interpretation: <1.31 ft/sec, indicative of household ambulator General Gait Details: Pt ambulates with poor bil feet clearance, but significant R foot drag. Tendency for R foot to get outside RW during turns and pt to push RW distal, needing repeated cues to correct. Pt with L lateral lean throughout. Slow and mildly unsteady gait but no LOB, min guard assist for safety.  Stairs            Wheelchair Mobility    Modified Rankin (Stroke Patients Only) Modified Rankin (Stroke Patients Only) Pre-Morbid Rankin Score: Moderate disability Modified Rankin: Moderately severe disability     Balance Overall balance assessment: Needs assistance Sitting-balance support: Feet supported Sitting balance-Leahy Scale: Fair Sitting balance - Comments: able to maintain static sitting with supervision Postural control: Left lateral lean Standing balance support: Bilateral upper extremity supported Standing balance-Leahy Scale: Poor Standing balance comment: requires UE support and has h/o falls                             Pertinent Vitals/Pain Pain Assessment: No/denies  pain    Home Living Family/patient expects to be discharged to:: Private residence Living Arrangements: Spouse/significant other;Children Available Help at Discharge: Family;Available 24 hours/day Type of Home: Other(Comment)  (townhouse) Home Access: Level entry     Home Layout: Two level;Able to live on main level with bedroom/bathroom Home Equipment: Walker - 4 wheels;Bedside commode;Shower seat Additional Comments: Pt and spouse live with their daughter and son in law.  they provide 24 hour assist    Prior Function Level of Independence: Needs assistance   Gait / Transfers Assistance Needed: pt ambulates with rollator, but daughter reports pt falls ~1x/week  ADL's / Homemaking Assistance Needed: Daughter assists with LB ADLs, and assists pt with shower transfers.  Pt incontinent of urine at night  Comments: pt requested that daughter translate.  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  Daughter reports pt with long standing weakness Rt UE from previous stroke, but that it is currently worse than it was at baseline. Pt denies numbness or tingling RUE Coordination: decreased gross motor;decreased fine motor    Lower Extremity Assessment Lower Extremity Assessment: RLE deficits/detail RLE Deficits / Details: Daughter reports pt with long standing weakness Rt LE from previous stroke, but that it is currently worse than it was at baseline. Pt denies numbness or tingling. inoordination RLE Sensation:  (denied numbness/tingling) RLE Coordination: decreased gross motor;decreased fine motor    Cervical / Trunk Assessment Cervical / Trunk Assessment: Kyphotic;Other exceptions Cervical / Trunk Exceptions: Pt demontrates Lt lateral lean with Rt lateral trunk flexion and elongation of Lt trunk  Communication   Communication: Prefers language other than English  Cognition Arousal/Alertness: Awake/alert Behavior During Therapy: WFL for tasks  assessed/performed Overall Cognitive Status: Difficult to assess                                        General Comments General comments (skin integrity, edema, etc.): VSS; daughter present and translating during session    Exercises     Assessment/Plan    PT Assessment Patient needs continued PT services  PT Problem List Decreased strength;Decreased range of motion;Decreased activity tolerance;Decreased balance;Decreased mobility;Decreased coordination;Decreased knowledge of use of DME;Decreased safety awareness       PT Treatment Interventions DME instruction;Gait training;Stair training;Functional mobility training;Therapeutic activities;Therapeutic exercise;Balance training;Neuromuscular re-education;Patient/family education;Cognitive remediation    PT Goals (Current goals can be found in the Care Plan section)  Acute Rehab PT Goals Patient Stated Goal: to go home soon PT Goal Formulation: With patient/family Time For Goal Achievement: 06/13/21 Potential to Achieve Goals: Good    Frequency Min 4X/week   Barriers to discharge        Co-evaluation PT/OT/SLP Co-Evaluation/Treatment: Yes Reason for Co-Treatment: To address functional/ADL transfers PT goals addressed during session: Mobility/safety with mobility;Balance;Proper use of DME OT goals addressed during session: ADL's and self-care       AM-PAC PT "6 Clicks" Mobility  Outcome Measure Help needed turning from your back to your side while in a flat bed without using bedrails?: A Little Help needed moving from lying on your back to sitting on the side of a flat bed without using bedrails?: A Lot Help needed moving  to and from a bed to a chair (including a wheelchair)?: A Little Help needed standing up from a chair using your arms (e.g., wheelchair or bedside chair)?: A Little Help needed to walk in hospital room?: A Little Help needed climbing 3-5 steps with a railing? : A Little 6 Click Score:  17    End of Session Equipment Utilized During Treatment: Gait belt Activity Tolerance: Patient tolerated treatment well Patient left: in bed;with call bell/phone within reach;with family/visitor present Nurse Communication: Mobility status PT Visit Diagnosis: Unsteadiness on feet (R26.81);Other abnormalities of gait and mobility (R26.89);Muscle weakness (generalized) (M62.81);Repeated falls (R29.6);History of falling (Z91.81);Difficulty in walking, not elsewhere classified (R26.2);Other symptoms and signs involving the nervous system (R29.898);Hemiplegia and hemiparesis Hemiplegia - Right/Left: Right Hemiplegia - dominant/non-dominant: Dominant Hemiplegia - caused by: Cerebral infarction    Time: 0826-0853 PT Time Calculation (min) (ACUTE ONLY): 27 min   Charges:   PT Evaluation $PT Eval Moderate Complexity: 1 Mod          Raymond Gurney, PT, DPT Acute Rehabilitation Services  Pager: [phone] Office: [phone]   Jewel Baize 06/06/2021, 9:49 AM

## 2021-06-06 NOTE — ED Notes (Signed)
Daughter at bedside states that they would like to go home. This RN explained to pt and daughter that pt has pending echo to be done. Willing to wait for procedure to be done and would very much like to go home.

## 2021-06-06 NOTE — TOC Progression Note (Signed)
[facility] - [facility]    Patient Details  Name: Shannon Bender MRN: [id number] Date of Birth: 1956/01/28  Transition of [facility]) CM/SW Cecilia, LCSW Phone Number: 06/06/2021, 10:35 AM  Clinical Narrative:    TOC contacted regarding home health needs. CSW notes insurance likely barrier. CSW coordinated with attending and RNCM. CSW met with patient and daughter and discussed home health and barriers. Per daughter patient's primary care doctor has been working on SNF referrals for the past three months unsuccessfully. CSW noted per [facility] records the last update was on 05/15/21 from an [facility] CSW. CSW contacted [facility] which the note indicated and unfortunately they were unable to take the referral. CSW spoke with Angie with [facility] and they are able to take [facility] PT/OT with patient's insurance and requested social worker added to the order to help family with PCS. CSW updated family were responded positively to this news. Please contact TOC for additional discharge supports.    Expected Discharge Plan: [place] Barriers to Discharge: Inadequate or no insurance  Expected Discharge Plan and Services Expected Discharge Plan: [place] Choice: Home Health                                         Social Determinants of Health (SDOH) Interventions    Readmission Risk Interventions No flowsheet data found.

## 2021-06-06 NOTE — Consult Note (Signed)
NEUROLOGY CONSULTATION NOTE   Date of service: June 06, 2021 Patient Name: Shannon Bender MRN:  [id number] DOB:  July 16, 1956 Reason for consult: "pontine stroke" Requesting Provider: Charlsie Quest, MD _ _ _   _ __   _ __ _ _  __ __   _ __   __ _  History of Present Illness  Shannon Bender is a 65 y.o. female with PMH significant for hypertension, diabetes, rheumatoid arthritis on Enbrel, history of stroke about 14 years ago with very mild residual right-sided weakness who presents with right facial numbness.  Patient reports she woke up at 0400 on 06/05/2021 and was at her ears baseline and normal self.  She got ready to play when she noted that she had fullness in her head and had a year along with numbness in her right face.  She did not think too much about it and attribute it all of her symptoms to potential sinusitis so she went to bed shortly afterwards and woke up at 7 AM with persistent right facial numbness.  She initially went to the emergency department at [facility] and was sent to [facility] for an MRI brain which demonstrated right pontine stroke.  She does not smoke, she does not drink alcohol, does not use any recreational substances.  Endorses prior history of stroke about 14 years ago with residual mild right-sided weakness.  She reports that she has had difficulty controlling her glucose and her blood pressure since she was started on Enbrel by her doctors.  She was just taken off of it a couple days ago and feels that its easy to get her glucose under control now.  Feels her numbness is a little better but is still present..  mRS: 1 tPA/thrombectomy: outside window, no LVO NIHSS components Score: Comment  1a Level of Conscious 0[x]  1[]  2[]  3[]      1b LOC Questions 0[x]  1[]  2[]       1c LOC Commands 0[x]  1[]  2[]       2 Best Gaze 0[x]  1[]  2[]       3 Visual 0[x]  1[]  2[]  3[]      4 Facial Palsy 0[x]  1[]  2[]  3[]      5a Motor Arm - left 0[x]  1[]  2[]  3[]  4[]  UN[]     5b Motor Arm - Right 0[x]  1[]  2[]  3[]  4[]  UN[]    6a Motor Leg - Left 0[x]  1[]  2[]  3[]  4[]  UN[]    6b Motor Leg - Right 0[x]  1[]  2[]  3[]  4[]  UN[]    7 Limb Ataxia 0[x]  1[]  2[]  3[]  UN[]     8 Sensory 0[]  1[x]  2[]  UN[]      9 Best Language 0[x]  1[]  2[]  3[]      10 Dysarthria 0[x]  1[]  2[]  UN[]      11 Extinct. and Inattention 0[x]  1[]  2[]       TOTAL: 1      ROS   Constitutional Denies weight loss, fever and chills.   HEENT Denies changes in vision and hearing.   Respiratory Denies SOB and cough.   CV Denies palpitations and CP   GI Denies abdominal pain, nausea, vomiting and diarrhea.   GU Denies dysuria and urinary frequency.   MSK Denies myalgia and joint pain.   Skin Denies rash and pruritus.   Neurological Denies headache and syncope.   Psychiatric Denies recent changes in mood. Denies anxiety and depression.    Past History   Past Medical History:  Diagnosis Date   History of stroke    Hypertension  associated with diabetes (HCC)    Insulin dependent type 2 diabetes mellitus (HCC)    Rheumatoid arthritis (HCC)    Past Surgical History:  Procedure Laterality Date   CESAREAN SECTION     Family History  Problem Relation Age of Onset   Diabetes Mother    Hypertension Mother    Heart disease Father    Social History   Socioeconomic History   Marital status: Married    Spouse name: Not on file   Number of children: Not on file   Years of education: Not on file   Highest education level: Not on file  Occupational History   Not on file  Tobacco Use   Smoking status: Never   Smokeless tobacco: Never  Vaping Use   Vaping Use: Never used  Substance and Sexual Activity   Alcohol use: Never   Drug use: Never   Sexual activity: Not on file  Other Topics Concern   Not on file  Social History Narrative   Not on file   Social Determinants of Health   Financial Resource Strain: Not on file  Food Insecurity: Not on file  Transportation Needs: Not on file  Physical  Activity: Not on file  Stress: Not on file  Social Connections: Not on file   No Known Allergies  Medications  (Not in a hospital admission)    Vitals   Vitals:   06/05/21 1830 06/05/21 2030 06/05/21 2100 06/05/21 2145  BP: (!) 152/71 (!) 164/98 (!) 161/77 (!) 145/97  Pulse: 81 83 83 85  Resp: 18 16 14  (!) 22  Temp:      TempSrc:      SpO2: 96% 97% 98% 96%  Weight:      Height:         Body mass index is 28.46 kg/m.  Physical Exam   General: Laying comfortably in bed; in no acute distress.  HENT: Normal oropharynx and mucosa. Normal external appearance of ears and nose.  Neck: Supple, no pain or tenderness  CV: No JVD. No peripheral edema.  Pulmonary: Symmetric Chest rise. Normal respiratory effort.  Abdomen: Soft to touch, non-tender.  Ext: No cyanosis, edema, or deformity  Skin: No rash. Normal palpation of skin.   Musculoskeletal: Normal digits and nails by inspection. No clubbing.   Neurologic Examination  Mental status/Cognition: Alert, oriented to self, place, month and year, good attention.  Speech/language: Fluent, comprehension intact, object naming intact, repetition intact.  Cranial nerves:   CN II Pupils equal and reactive to light, no VF deficits    CN III,IV,VI EOM intact, no gaze preference or deviation, no nystagmus    CN V normal sensation in V1, V2, and V3 segments bilaterally    CN VII no asymmetry, no nasolabial fold flattening    CN VIII normal hearing to speech    CN IX & X normal palatal elevation, no uvular deviation    CN XI 5/5 head turn and 5/5 shoulder shrug bilaterally    CN XII midline tongue protrusion    Motor:  Muscle bulk: poor, tone normal Mvmt Root Nerve  Muscle Right Left Comments  SA C5/6 Ax Deltoid 4+ 4+   EF C5/6 Mc Biceps 5 5   EE C6/7/8 Rad Triceps 5 5   WF C6/7 Med FCR     WE C7/8 PIN ECU     F Ab C8/T1 U ADM/FDI 5 5   HF L1/2/3 Fem Illopsoas 4+ 4+   KE L2/3/4 Fem Quad  5 5   DF L4/5 D Peron Tib Ant 4 5   PF  S1/2 Tibial Grc/Sol 4 5    Reflexes:  Right Left Comments  Pectoralis      Biceps (C5/6) 1 1   Brachioradialis (C5/6) 1 1    Triceps (C6/7) 1 1    Patellar (L3/4) 1 1    Achilles (S1)      Hoffman      Plantar     Jaw jerk    Sensation:  Light touch Decreased in R face to touch.   Pin prick    Temperature    Vibration   Proprioception    Coordination/Complex Motor:  - Finger to Nose intact BL - Heel to shin intact BL - Rapid alternating movement are normal - Gait: Deferred.  Labs   CBC:  Recent Labs  Lab 06/05/21 1600  WBC 8.4  NEUTROABS 5.0  HGB 15.5*  HCT 44.4  MCV 89.7  PLT 94*    Basic Metabolic Panel:  Lab Results  Component Value Date   NA 140 06/05/2021   K 4.0 06/05/2021   CO2 27 06/05/2021   GLUCOSE 182 (H) 06/05/2021   BUN 17 06/05/2021   CREATININE 0.97 06/05/2021   CALCIUM 10.1 06/05/2021   GFRNONAA >60 06/05/2021   Lipid Panel: No results found for: LDLCALC HgbA1c: No results found for: HGBA1C Urine Drug Screen:     Component Value Date/Time   LABOPIA NONE DETECTED 06/05/2021 1654   COCAINSCRNUR NONE DETECTED 06/05/2021 1654   LABBENZ NONE DETECTED 06/05/2021 1654   AMPHETMU NONE DETECTED 06/05/2021 1654   THCU NONE DETECTED 06/05/2021 1654   LABBARB NONE DETECTED 06/05/2021 1654    Alcohol Level No results found for: ETH  CT Head without contrast(personally reviewed): CTH was negative for a large hypodensity concerning for a large territory infarct or hyperdensity concerning for an ICH  MR Angio head without contrast and MR angio neck with contrast: 50% R ICA stenosis.  MRI Brain: Small focus of acute ischemia within the inferior right pons. No hemorrhage or mass effect.  Impression   Shannon Bender is a 65 y.o. female with PMH significant for hypertension, diabetes, rheumatoid arthritis on Enbrel, history of stroke about 14 years ago with very mild residual right-sided weakness who presents with right facial numbness. 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  Primary Diagnosis:  Other cerebral infarction due to occlusion of stenosis of small artery.  Secondary Diagnosis: Essential (primary) hypertension and Type 2 diabetes mellitus w/o complications  Recommendations  Plan:  - Frequent Neuro checks per stroke unit protocol - Recommend obtaining TTE - Recommend obtaining Lipid panel with LDL - Please start statin if LDL > 70 - Recommend HbA1c - Antithrombotic - aspirin 81mg  daily along with plavix 75mg  daily x 21 days, followed by Aspirin 81mg  daily alone. - Recommend DVT ppx - SBP goal - permissive hypertension first 24 h < 220/110. Held home meds.  - Recommend Telemetry monitoring for arrythmia - Recommend bedside swallow screen prior to PO intake. - Stroke education booklet - Recommend PT/OT/SLP consult  ______________________________________________________________________   Thank you for the opportunity to take part in the care of this patient. If you have any further questions, please contact the neurology consultation attending.  Signed,  [facility] Pager Number _ _ _   _ __   _ __ _ _  __ __   _ __   __ _

## 2021-06-08 LAB — URINE CULTURE: Culture: 70000 — AB

## 2021-06-09 ENCOUNTER — Telehealth: Payer: Self-pay | Admitting: *Deleted

## 2021-06-09 NOTE — Progress Notes (Signed)
ED Antimicrobial Stewardship Positive Culture Follow Up   Shannon Bender is an 65 y.o. female who presented to [facility] on 06/05/2021 with a chief complaint of  Chief Complaint  Patient presents with   Numbness    Recent Results (from the past 720 hour(s))  Resp Panel by RT-PCR (Flu A&B, Covid) Nasopharyngeal Swab     Status: None   Collection Time: 06/05/21  4:00 PM   Specimen: Nasopharyngeal Swab; Nasopharyngeal(NP) swabs in vial transport medium  Result Value Ref Range Status   SARS Coronavirus 2 by RT PCR NEGATIVE NEGATIVE Final    Comment: (NOTE) SARS-CoV-2 target nucleic acids are NOT DETECTED.  The SARS-CoV-2 RNA is generally detectable in upper respiratory specimens during the acute phase of infection. The lowest concentration of SARS-CoV-2 viral copies this assay can detect is 138 copies/mL. A negative result does not preclude SARS-Cov-2 infection and should not be used as the sole basis for treatment or other patient management decisions. A negative result may occur with  improper specimen collection/handling, submission of specimen other than nasopharyngeal swab, presence of viral mutation(s) within the areas targeted by this assay, and inadequate number of viral copies(<138 copies/mL). A negative result must be combined with clinical observations, patient history, and epidemiological information. The expected result is Negative.  Fact Sheet for Patients:  BloggerCourse.com  Fact Sheet for Healthcare Providers:  SeriousBroker.it  This test is no t yet approved or cleared by the [place] FDA and  has been authorized for detection and/or diagnosis of SARS-CoV-2 by FDA under an Emergency Use Authorization (EUA). This EUA will remain  in effect (meaning this test can be used) for the duration of the COVID-19 declaration under Section 564(b)(1) of the Act, 21 U.S.C.section 360bbb-3(b)(1), unless the authorization is  terminated  or revoked sooner.       Influenza A by PCR NEGATIVE NEGATIVE Final   Influenza B by PCR NEGATIVE NEGATIVE Final    Comment: (NOTE) The Xpert Xpress SARS-CoV-2/FLU/RSV plus assay is intended as an aid in the diagnosis of influenza from Nasopharyngeal swab specimens and should not be used as a sole basis for treatment. Nasal washings and aspirates are unacceptable for Xpert Xpress SARS-CoV-2/FLU/RSV testing.  Fact Sheet for Patients: BloggerCourse.com  Fact Sheet for Healthcare Providers: SeriousBroker.it  This test is not yet approved or cleared by the [place] FDA and has been authorized for detection and/or diagnosis of SARS-CoV-2 by FDA under an Emergency Use Authorization (EUA). This EUA will remain in effect (meaning this test can be used) for the duration of the COVID-19 declaration under Section 564(b)(1) of the Act, 21 U.S.C. section 360bbb-3(b)(1), unless the authorization is terminated or revoked.  Performed at [facility], [address] [place], [place] [address]   Urine Culture     Status: Abnormal   Collection Time: 06/05/21  4:54 PM   Specimen: Urine, Clean Catch  Result Value Ref Range Status   Specimen Description   Final    URINE, CLEAN CATCH Performed at [facility], [address] [place], [place] [address]    Special Requests   Final    NONE Performed at [facility], [address] [place], [place] [address]    Culture (A)  Final    70,000 COLONIES/mL ESCHERICHIA COLI Confirmed Extended Spectrum Beta-Lactamase Producer (ESBL).  In bloodstream infections from ESBL organisms, carbapenems are preferred over piperacillin/tazobactam. They are shown to have a lower risk of mortality.  Report Status 06/08/2021 FINAL  Final   Organism ID, Bacteria ESCHERICHIA COLI (A)  Final      Susceptibility   Escherichia coli - MIC*    AMPICILLIN >=32 RESISTANT  Resistant     CEFAZOLIN >=64 RESISTANT Resistant     CEFEPIME 2 SENSITIVE Sensitive     CEFTRIAXONE >=64 RESISTANT Resistant     CIPROFLOXACIN 1 SENSITIVE Sensitive     GENTAMICIN <=1 SENSITIVE Sensitive     IMIPENEM <=0.25 SENSITIVE Sensitive     NITROFURANTOIN <=16 SENSITIVE Sensitive     TRIMETH/SULFA <=20 SENSITIVE Sensitive     AMPICILLIN/SULBACTAM 4 SENSITIVE Sensitive     PIP/TAZO <=4 SENSITIVE Sensitive     * 70,000 COLONIES/mL ESCHERICHIA COLI     [x]  Patient discharged originally without antimicrobial agent and treatment is now indicated  New antibiotic prescription: Macrobid 100mg  BID x5 days  ED Provider: Dr.Trifan   06/09/2021, 10:29 AM Clinical Pharmacist Monday - Friday phone -  [phone] Saturday - Sunday phone - [phone]

## 2021-06-09 NOTE — Telephone Encounter (Signed)
Post ED Visit - Positive Culture Follow-up: Successful Patient Follow-Up  Culture assessed and recommendations reviewed by:  []  , Pharm.D. []  Enzo Bi, Pharm.D., BCPS AQ-ID []  , Pharm.D., BCPS []  Celedonio Miyamoto, Pharm.D., BCPS []  [place], Garvin Fila.D., BCPS, AAHIVP []  , Pharm.D., BCPS, AAHIVP []  Georgina Pillion, PharmD, BCPS []  , PharmD, BCPS []  [place], PharmD, BCPS []  [address], PharmD  Positive URINE culture  []  Patient discharged without antimicrobial prescription and treatment is now indicated []  Organism is resistant to prescribed ED discharge antimicrobial []  Patient with positive blood cultures  Changes discussed with ED provider: , [facility] New antibiotic prescription Macrobid 100mg  PO BID x 5 days Called to , [place] [phone]  Contacted daughter of patient, date 06/09/2021, time 1000   Phillips Climes 06/09/2021, 10:04 AM

## 2021-06-11 ENCOUNTER — Telehealth: Payer: Self-pay

## 2021-06-11 NOTE — Telephone Encounter (Signed)
Call from [employer] CSW in the ED< she received a call from family on [place] [address], [facility] services, had been attempting to get Home Health Had called [facility], [facility], and [facility], none could accepts, looking for a match to pair.for services. ED TOC staff taking over finding Hhif possible.

## 2022-05-07 ENCOUNTER — Ambulatory Visit (HOSPITAL_COMMUNITY): Payer: Medicaid Other | Admitting: Psychiatry

## 2022-07-01 ENCOUNTER — Telehealth (HOSPITAL_COMMUNITY): Payer: Medicaid Other | Admitting: Psychiatry

## 2022-07-26 ENCOUNTER — Telehealth (HOSPITAL_COMMUNITY): Payer: Medicaid Other | Admitting: Psychiatry

## 2022-08-16 ENCOUNTER — Telehealth (HOSPITAL_COMMUNITY): Payer: Medicaid Other | Admitting: Psychiatry

## 2022-09-06 ENCOUNTER — Ambulatory Visit (HOSPITAL_COMMUNITY): Payer: Medicaid Other | Admitting: Psychiatry

## 2022-09-06 ENCOUNTER — Encounter (HOSPITAL_COMMUNITY): Payer: Self-pay

## 2022-10-23 IMAGING — MR MR MRA HEAD W/O CM
2 series · 19 of 48 positions shown · non-contrast
Comparison: No pertinent prior exam.

CLINICAL DATA: Acute neurologic deficit

EXAM:
MRI HEAD WITHOUT CONTRAST
MRA HEAD WITHOUT CONTRAST
TECHNIQUE: Multiplanar, multi-echo pulse sequences of the brain and surrounding
structures were acquired without intravenous contrast. Angiographic
images of the Circle of Willis were acquired using MRA technique
without intravenous contrast.

[Series 2: ax (id) · axial · 1.0mm · 0.43mm/px · z∈[-1,+82]mm · 18 of 176 slices shown]
[im 1/176]
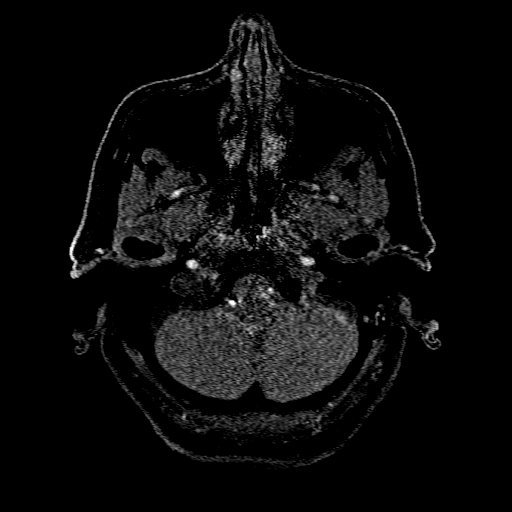
[im 4/176]
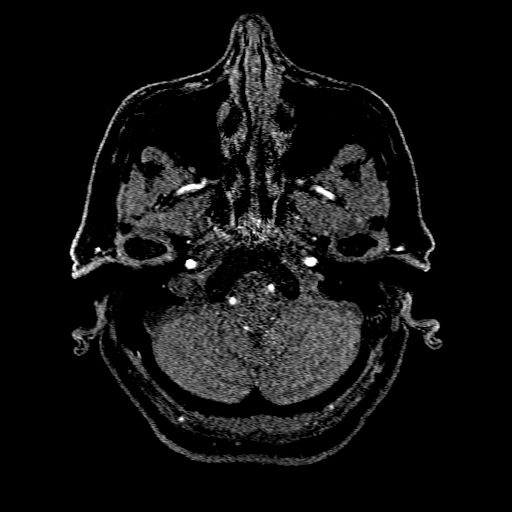
[im 8/176]
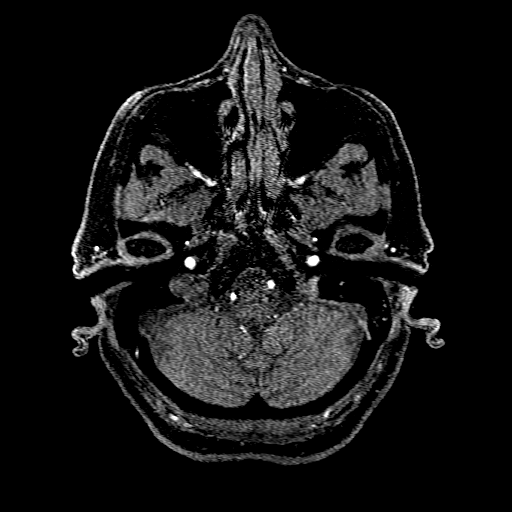
[im 12/176]
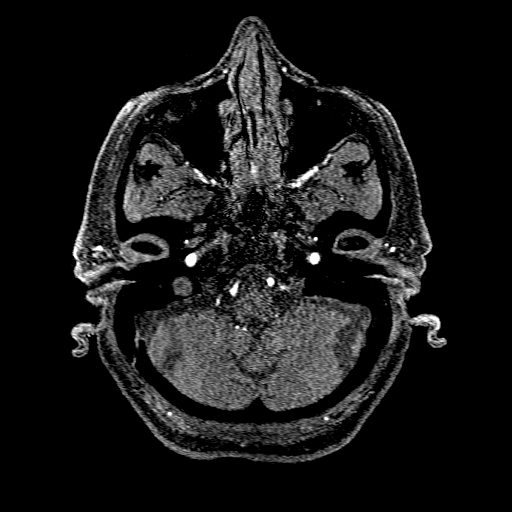
[im 16/176]
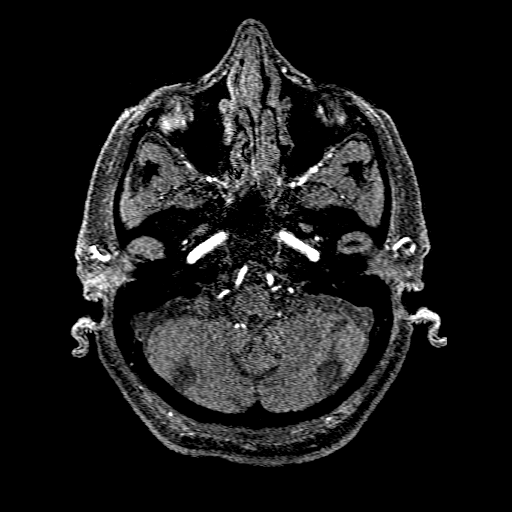
[im 20/176]
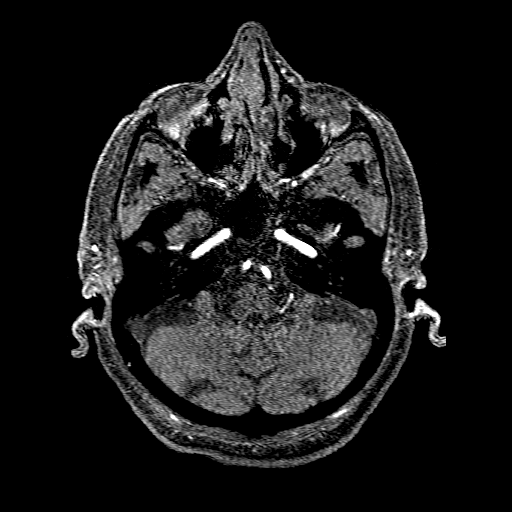
[im 23/176]
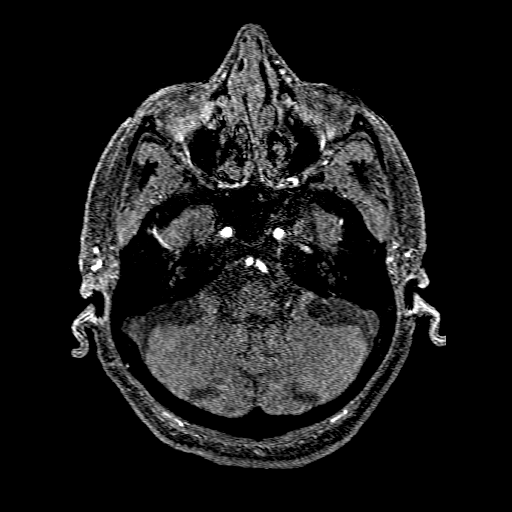
[im 27/176]
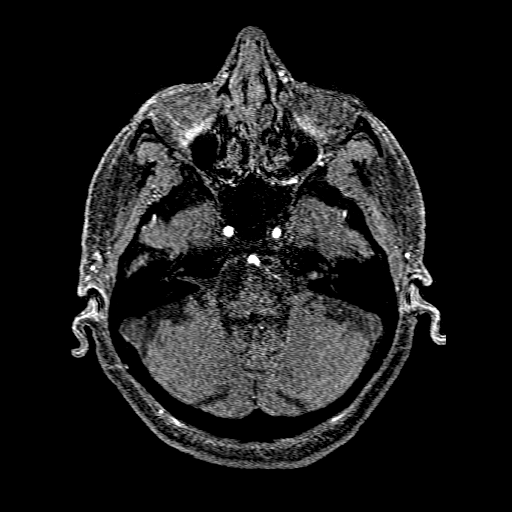
[im 31/176]
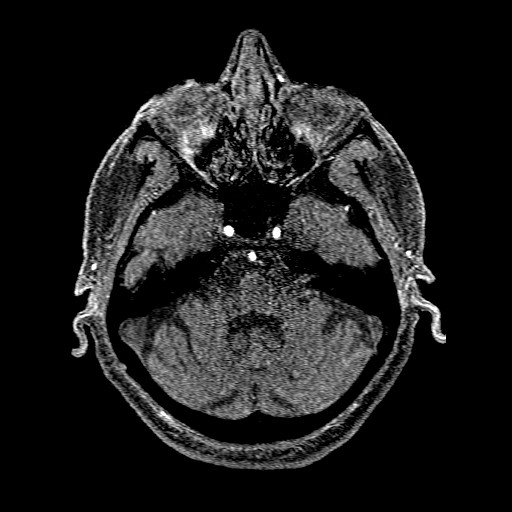
[im 35/176]
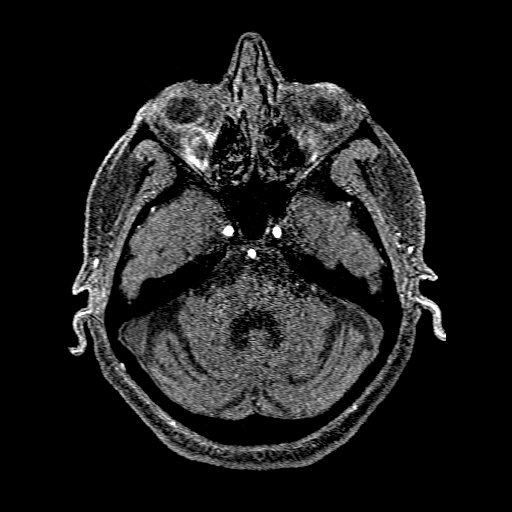
[im 54/176]
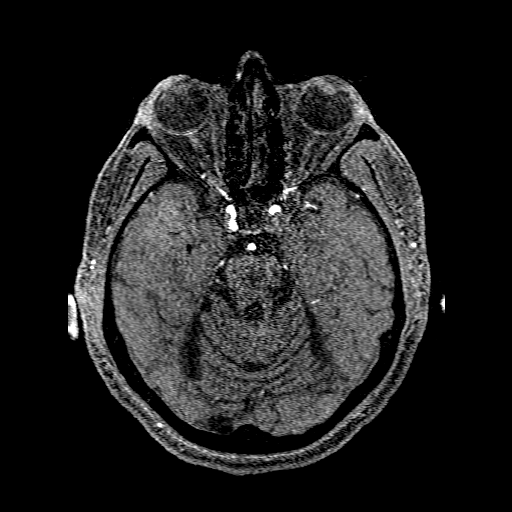
[im 77/176]
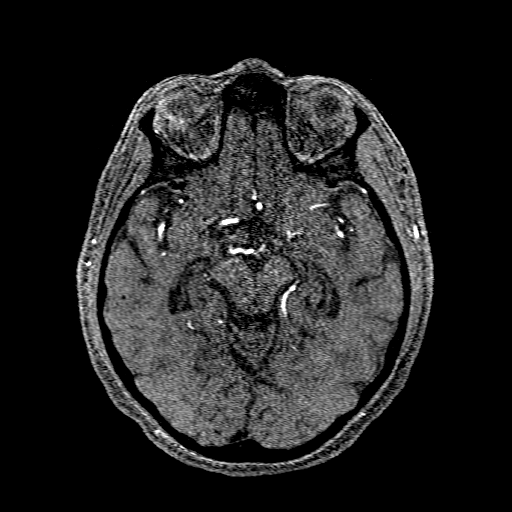
[im 88/176]
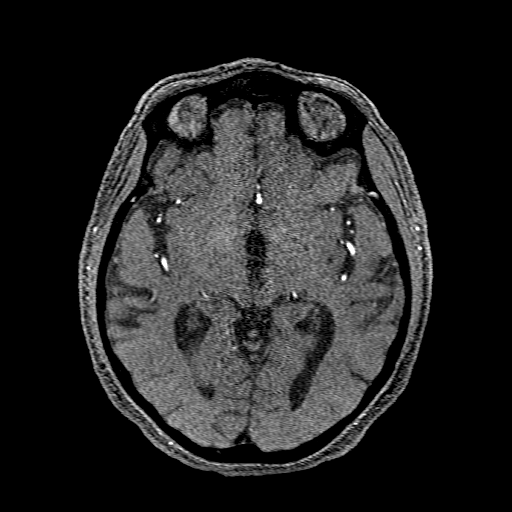
[im 99/176]
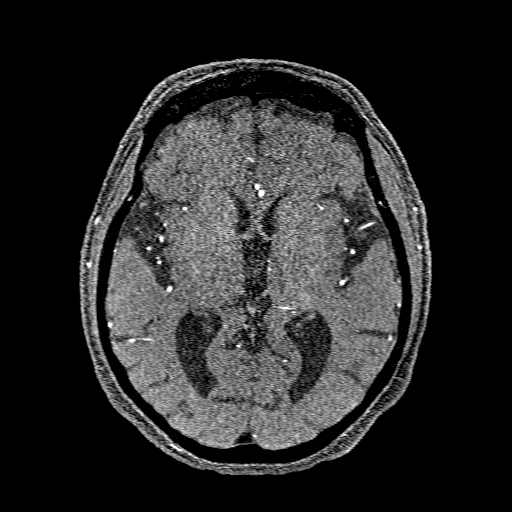
[im 122/176]
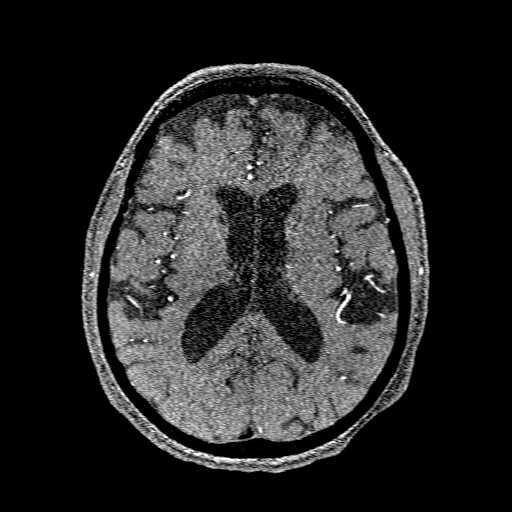
[im 145/176]
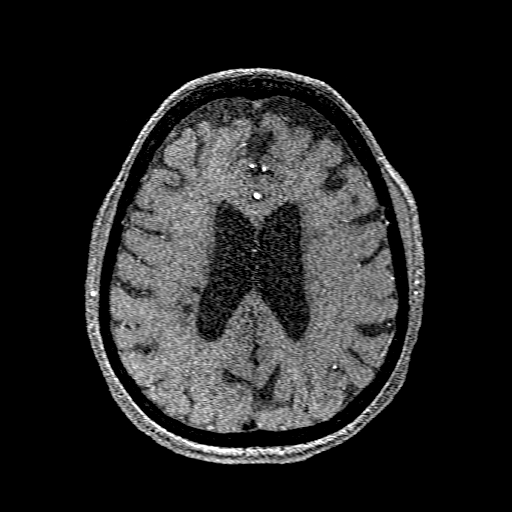
[im 149/176]
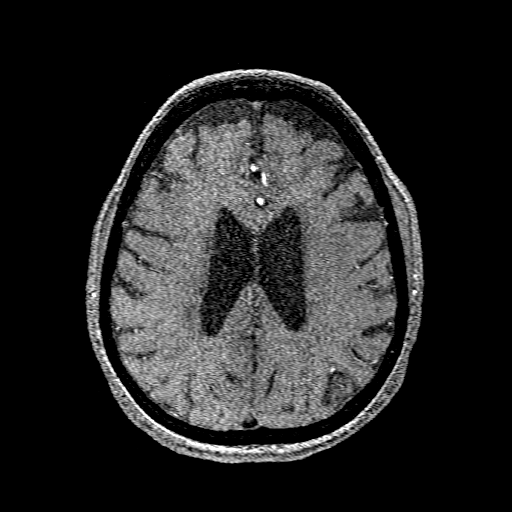
[im 168/176]
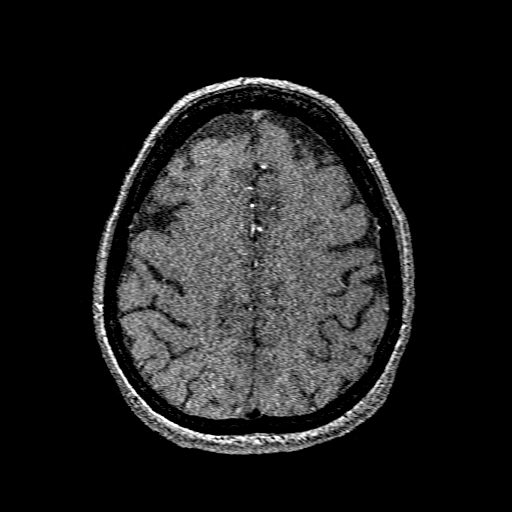

[Series 201: pjn:ax (id) · sagittal · 1.0mm · 0.43mm/px · 1 of 3 slices shown]
[im 1/3]
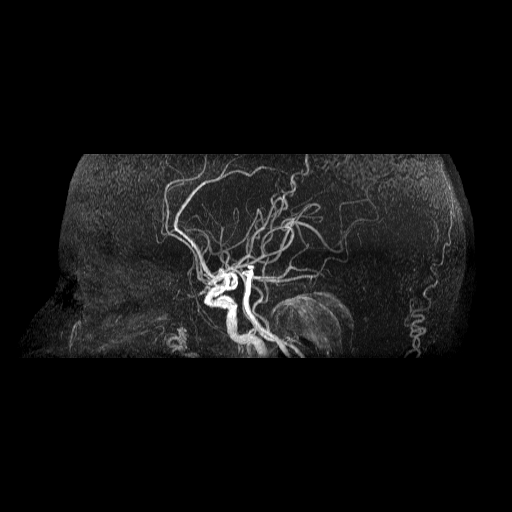

[19 of 48 positions shown; findings below may reference images not displayed]

FINDINGS: MRI HEAD FINDINGS

Brain: Small focus of abnormal diffusion restriction within the
inferior right pons. Chronic microhemorrhage in the right temporal
lobe. Hyperintense T2-weighted signal is moderately widespread
throughout the white matter. Advanced atrophy for age. There are old
bilateral cerebellar small vessel infarcts. The midline structures
are normal.

Vascular: Major flow voids are preserved.

Skull and upper cervical spine: Normal calvarium and skull base.
Visualized upper cervical spine and soft tissues are normal.

Sinuses/Orbits:No paranasal sinus fluid levels or advanced mucosal
thickening. No mastoid or middle ear effusion. Normal orbits.

MRA HEAD FINDINGS

POSTERIOR CIRCULATION:

--Vertebral arteries: Normal

--Inferior cerebellar arteries: Normal.

--Basilar artery: Normal.

--Superior cerebellar arteries: Normal.

--Posterior cerebral arteries: Narrowing of the proximal right P2
segment. Otherwise normal.

ANTERIOR CIRCULATION:

--Intracranial internal carotid arteries: Normal.

--Anterior cerebral arteries (ACA): Normal.

--Middle cerebral arteries (MCA): Normal.

ANATOMIC VARIANTS: None
IMPRESSION: 1. Small focus of acute ischemia within the inferior right pons. No
hemorrhage or mass effect.
2. Advanced atrophy and chronic small vessel disease.
3. Normal intracranial MRA.

## 2022-10-23 IMAGING — MR MR MRA NECK WO/W CM
4 of 7 series · 19 of 48 positions shown · IV contrast (Yes GAD)
Comparison: None.

CLINICAL DATA: Acute neurologic deficit

EXAM:
MRA NECK WITHOUT AND WITH CONTRAST
TECHNIQUE: Multiplanar and multiecho pulse sequences of the neck were obtained
without and with intravenous contrast. Angiographic images of the
neck were obtained using MRA technique without and with intravenous
contrast.
CONTRAST:  6.5mL GADAVIST GADOBUTROL 1 MMOL/ML IV SOLN

[Series 300: cor cemra ft · coronal · 1.2mm · 0.59mm/px · 7 of 113 slices shown]
[im 1/113]
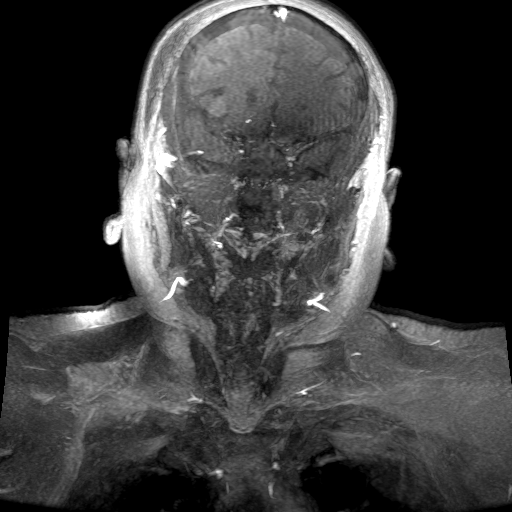
[im 19/113]
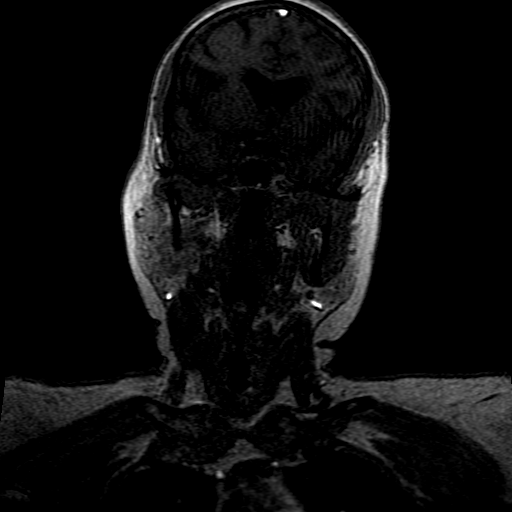
[im 38/113]
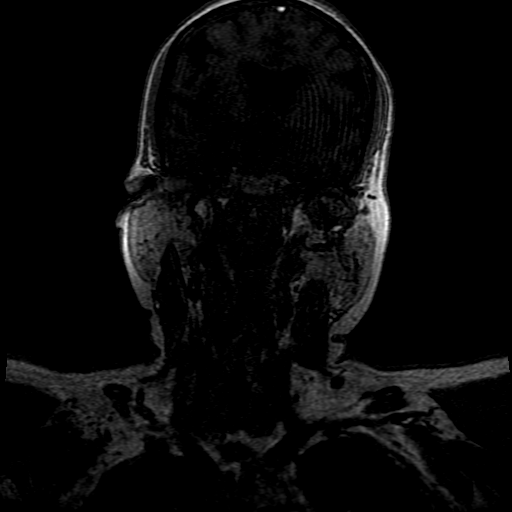
[im 57/113]
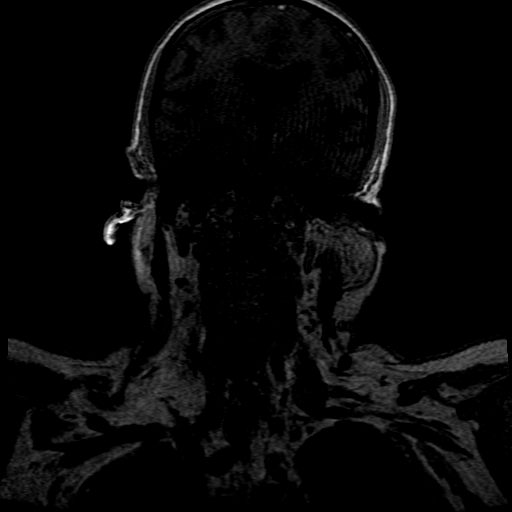
[im 75/113]
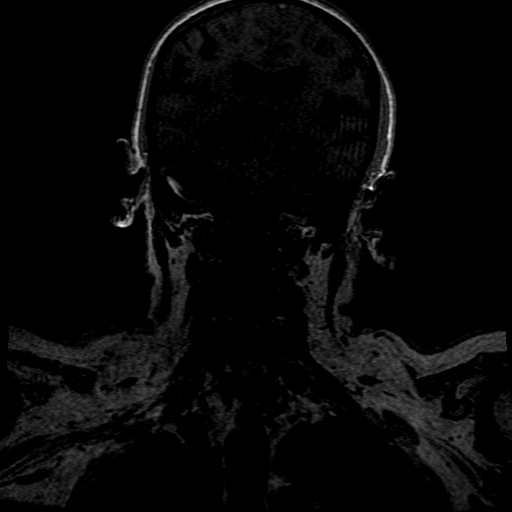
[im 94/113]
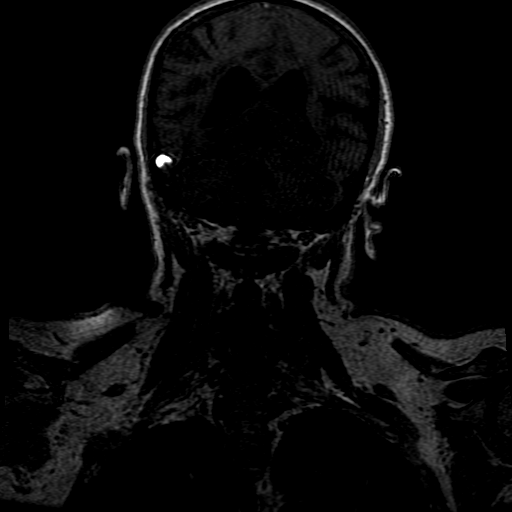
[im 113/113]
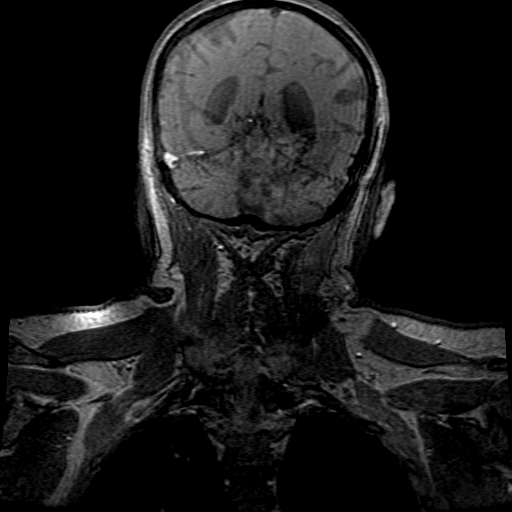

[Series 301: ph1/cor cemra ft · coronal · 1.2mm · 0.59mm/px · 6 of 112 slices shown]
[im 1/112]
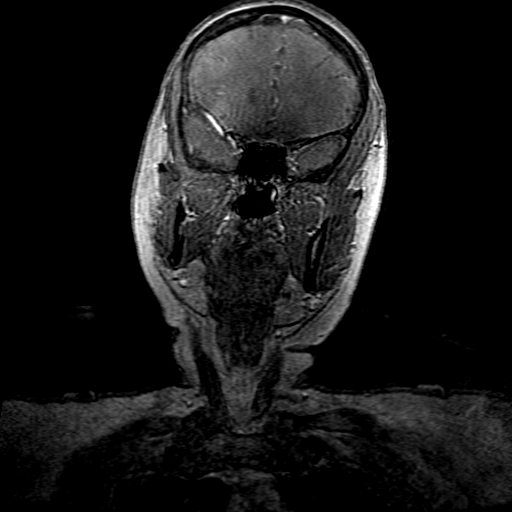
[im 19/112]
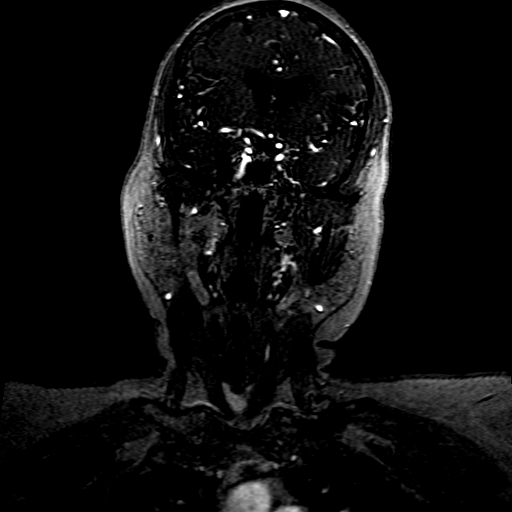
[im 38/112]
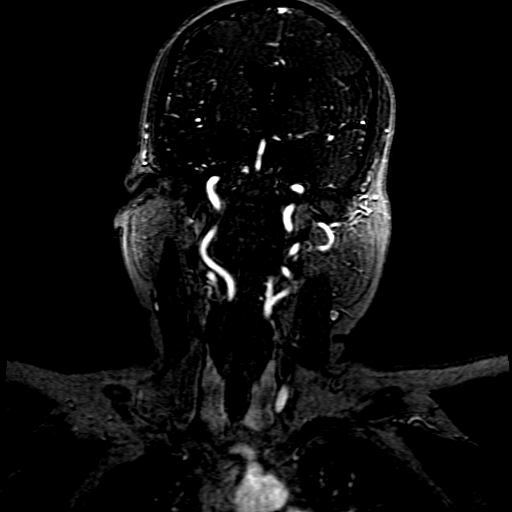
[im 56/112]
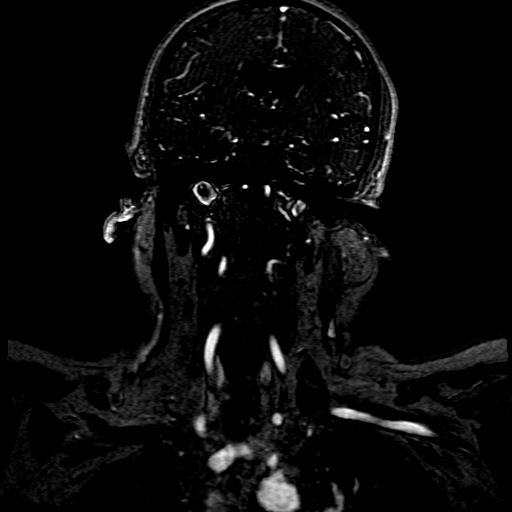
[im 75/112]
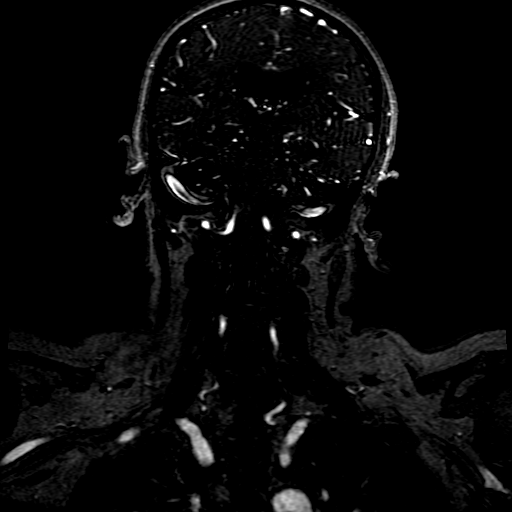
[im 93/112]
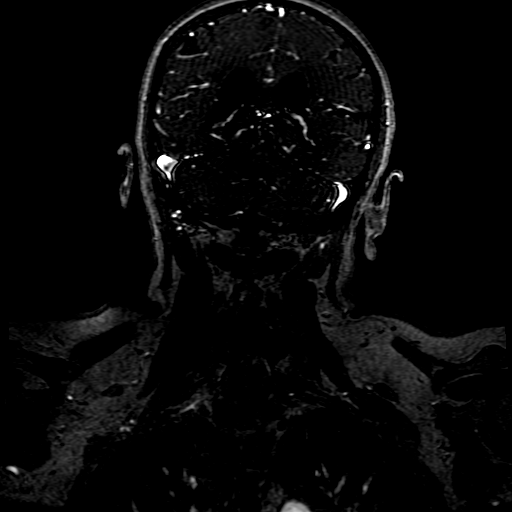

[Series 302: ph2/cor cemra ft · coronal · 1.2mm · 0.59mm/px · 3 of 113 slices shown]
[im 19/113]
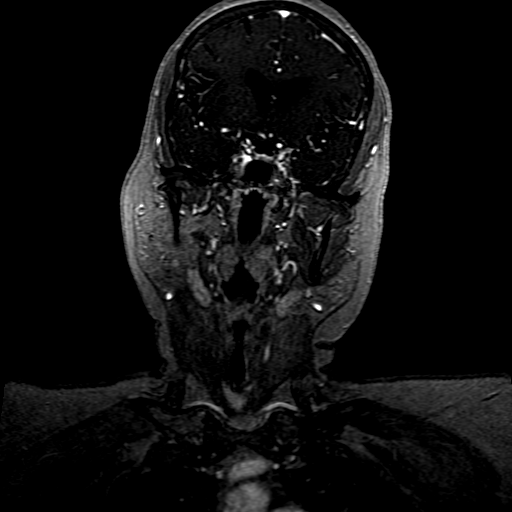
[im 57/113]
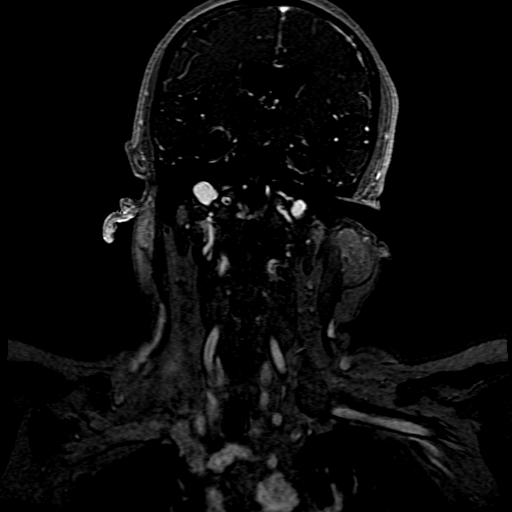
[im 94/113]
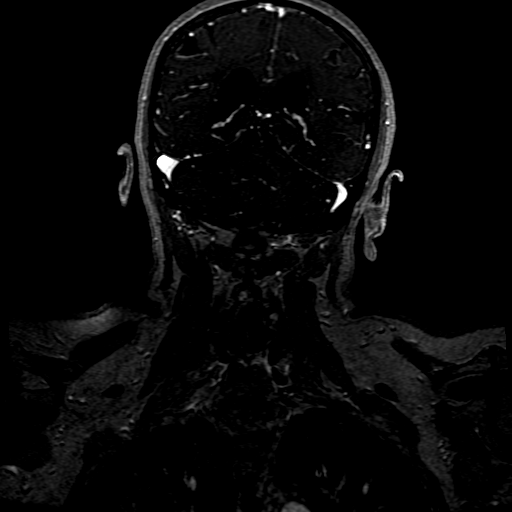

[((date))-((date)) · coronal · 1.2mm · 0.59mm/px · 3 of 113 slices shown]
[im 19/113]
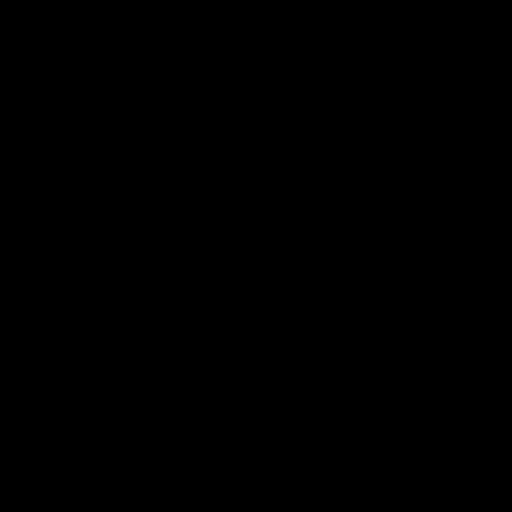
[im 57/113]
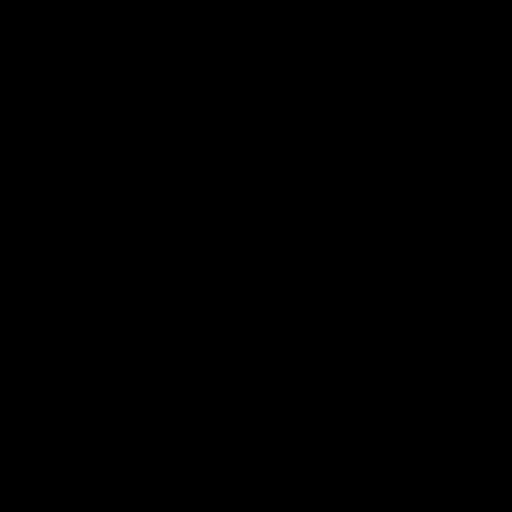
[im 94/113]
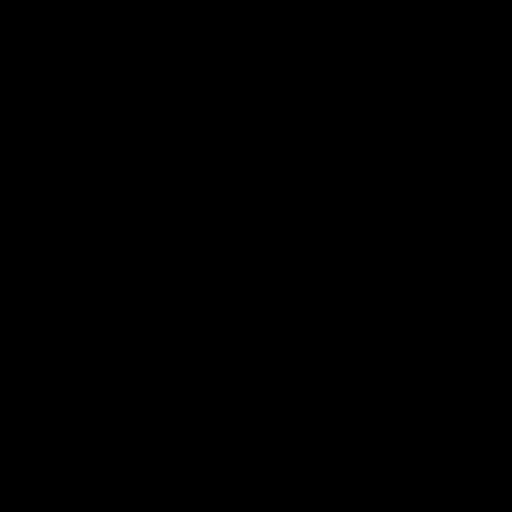

[19 of 48 positions shown; findings below may reference images not displayed]

FINDINGS: There is approximately 50% stenosis of the proximal right internal
carotid artery. The left carotid system and both vertebral arteries
are normal. Three-vessel branching pattern of the aortic arch.
IMPRESSION: 1. Approximately 50% stenosis of the proximal right internal carotid
artery.
2. No left carotid or vertebral artery stenosis.

## 2024-01-02 ENCOUNTER — Emergency Department (HOSPITAL_BASED_OUTPATIENT_CLINIC_OR_DEPARTMENT_OTHER)
Admission: EM | Admit: 2024-01-02 | Discharge: 2024-01-02 | Disposition: A | Discharge status: Home / Self Care | Attending: Emergency Medicine | Admitting: Emergency Medicine

## 2024-01-02 ENCOUNTER — Encounter (HOSPITAL_BASED_OUTPATIENT_CLINIC_OR_DEPARTMENT_OTHER): Payer: Self-pay

## 2024-01-02 DIAGNOSIS — E119 Type 2 diabetes mellitus without complications: Secondary | ICD-10-CM | POA: Insufficient documentation

## 2024-01-02 DIAGNOSIS — Z7902 Long term (current) use of antithrombotics/antiplatelets: Secondary | ICD-10-CM | POA: Diagnosis not present

## 2024-01-02 DIAGNOSIS — Z794 Long term (current) use of insulin: Secondary | ICD-10-CM | POA: Insufficient documentation

## 2024-01-02 DIAGNOSIS — Z7982 Long term (current) use of aspirin: Secondary | ICD-10-CM | POA: Diagnosis not present

## 2024-01-02 DIAGNOSIS — Z8673 Personal history of transient ischemic attack (TIA), and cerebral infarction without residual deficits: Secondary | ICD-10-CM | POA: Insufficient documentation

## 2024-01-02 DIAGNOSIS — N390 Urinary tract infection, site not specified: Secondary | ICD-10-CM | POA: Diagnosis not present

## 2024-01-02 DIAGNOSIS — Z7984 Long term (current) use of oral hypoglycemic drugs: Secondary | ICD-10-CM | POA: Insufficient documentation

## 2024-01-02 DIAGNOSIS — R350 Frequency of micturition: Secondary | ICD-10-CM | POA: Diagnosis present

## 2024-01-02 DIAGNOSIS — I10 Essential (primary) hypertension: Secondary | ICD-10-CM | POA: Diagnosis not present

## 2024-01-02 DIAGNOSIS — Z79899 Other long term (current) drug therapy: Secondary | ICD-10-CM | POA: Insufficient documentation

## 2024-01-02 LAB — CBC
HCT: 38.2 % (ref 36.0–46.0)
Hemoglobin: 12.8 g/dL (ref 12.0–15.0)
MCH: 30.8 pg (ref 26.0–34.0)
MCHC: 33.5 g/dL (ref 30.0–36.0)
MCV: 91.8 fL (ref 80.0–100.0)
Platelets: 96 10*3/uL — ABNORMAL LOW (ref 150–400)
RBC: 4.16 MIL/uL (ref 3.87–5.11)
RDW: 16 % — ABNORMAL HIGH (ref 11.5–15.5)
WBC: 5.7 10*3/uL (ref 4.0–10.5)
nRBC: 0 % (ref 0.0–0.2)

## 2024-01-02 LAB — URINALYSIS, W/ REFLEX TO CULTURE (INFECTION SUSPECTED)
Bilirubin Urine: NEGATIVE
Glucose, UA: 100 mg/dL — AB
Ketones, ur: NEGATIVE mg/dL
Nitrite: NEGATIVE
Protein, ur: >=300 mg/dL — AB
Specific Gravity, Urine: 1.025 (ref 1.005–1.030)
WBC, UA: >50 WBC/hpf (ref 0–5)
pH: 6 (ref 5.0–8.0)

## 2024-01-02 LAB — BASIC METABOLIC PANEL WITH GFR
Anion gap: 10 (ref 5–15)
BUN: 21 mg/dL (ref 8–23)
CO2: 27 mmol/L (ref 22–32)
Calcium: 9.5 mg/dL (ref 8.9–10.3)
Chloride: 103 mmol/L (ref 98–111)
Creatinine, Ser: 1.11 mg/dL — ABNORMAL HIGH (ref 0.44–1.00)
GFR, Estimated: 54 mL/min — ABNORMAL LOW (ref >60)
Glucose, Bld: 154 mg/dL — ABNORMAL HIGH (ref 70–99)
Potassium: 3.7 mmol/L (ref 3.5–5.1)
Sodium: 140 mmol/L (ref 135–145)

## 2024-01-02 MED ORDER — FLUCONAZOLE 150 MG PO TABS: 150.0000 mg | ORAL_TABLET | Freq: Once | ORAL | 0 refills | Status: AC

## 2024-01-02 MED ORDER — CEFUROXIME AXETIL 250 MG PO TABS: 250.0000 mg | ORAL_TABLET | Freq: Two times a day (BID) | ORAL | 0 refills | Status: AC

## 2024-01-02 MED ORDER — KETOROLAC TROMETHAMINE 15 MG/ML IJ SOLN
15.0000 mg | Freq: Once | INTRAMUSCULAR | Status: AC
  Administered 2024-01-02: 15 mg via INTRAVENOUS
  Filled 2024-01-02: qty 1

## 2024-01-02 MED ORDER — SODIUM CHLORIDE 0.9 % IV SOLN
1.0000 g | Freq: Once | INTRAVENOUS | Status: AC
  Administered 2024-01-02: 1 g via INTRAVENOUS
  Filled 2024-01-02: qty 10

## 2024-01-02 NOTE — ED Provider Notes (Signed)
 [facility] Provider Note   CSN: [id number] Arrival date & time: 01/02/24  1130     History  Chief Complaint  Patient presents with   Urinary Frequency    Shannon Bender is a 68 y.o. female.   Urinary Frequency     Patient presents to the ED for evaluation of urinary frequency and discomfort.  Patient has a history of recurrent urinary tract infections.  She also has history of diabetes hypertension stroke rheumatoid arthritis.  She has been followed by urologist associated with [facility].  Patient's been having urinary issues for about 5 to 6 months.  She has been treated for recurrent urinary tract infections.  Patient's symptoms will improve after a course of antibiotics but then the symptoms returned again.  Patient has been having urinary frequency and urgency recently.  She also has pain and discomfort.  No fevers or chills.  No vomiting or diarrhea.  Home Medications Prior to Admission medications   Medication Sig Start Date End Date Taking? Authorizing Provider  cefUROXime (CEFTIN) 250 MG tablet Take 1 tablet (250 mg total) by mouth 2 (two) times daily with a meal for 7 days. 01/02/24 01/09/24 Yes Linwood Dibbles, MD  fluconazole (DIFLUCAN) 150 MG tablet Take 1 tablet (150 mg total) by mouth once for 1 dose. 01/02/24 01/02/24 Yes Linwood Dibbles, MD  allopurinol (ZYLOPRIM) 300 MG tablet Take 300 mg by mouth daily. 04/16/21   [provider]  amLODipine (NORVASC) 5 MG tablet Take 5 mg by mouth daily. 04/16/21   [provider]  aspirin EC 81 MG EC tablet Take 1 tablet (81 mg total) by mouth daily. Swallow whole. 06/07/21   Ghimire, Werner Lean, MD  atorvastatin (LIPITOR) 40 MG tablet Take 1 tablet (40 mg total) by mouth daily. 06/07/21   Ghimire, Werner Lean, MD  canagliflozin (INVOKANA) 100 MG TABS tablet Take 100 mg by mouth daily before breakfast.    [provider]  cetirizine (ZYRTEC) 10 MG tablet Take 10 mg by mouth  daily as needed for allergies. 05/18/21   [provider]  clopidogrel (PLAVIX) 75 MG tablet Take 1 tablet (75 mg total) by mouth daily. 06/07/21   Ghimire, Werner Lean, MD  escitalopram (LEXAPRO) 10 MG tablet Take 10 mg by mouth daily. 04/16/21   [provider]  Etanercept (ENBREL MINI) 50 MG/ML SOCT Inject 50 mg into the skin once a week. Sundays 12/02/20   [provider]  folic acid (FOLVITE) 1 MG tablet Take 1 mg by mouth daily. 04/08/21   [provider]  ibuprofen (ADVIL) 200 MG tablet Take 400 mg by mouth every 6 (six) hours as needed for mild pain.    [provider]  insulin NPH-regular Human (70-30) 100 UNIT/ML injection Inject 10-20 Units into the skin 2 (two) times daily. Takes 20 units in the morning and 10 units in the evening    [provider]  losartan (COZAAR) 25 MG tablet Take 25 mg by mouth daily. 04/16/21   [provider]  metFORMIN (GLUCOPHAGE) 850 MG tablet Take 850 mg by mouth 2 (two) times daily with a meal.    [provider]  pantoprazole (PROTONIX) 40 MG tablet Take 1 tablet (40 mg total) by mouth daily. 06/06/21 06/06/22  Ghimire, Werner Lean, MD  sitaGLIPtin (JANUVIA) 50 MG tablet Take 50 mg by mouth daily.    [provider]  triamcinolone cream (KENALOG) 0.5 % Apply 1 application topically daily  as needed (itching). 02/23/21   [provider]      Allergies    Patient has no known allergies.    Review of Systems   Review of Systems  Genitourinary:  Positive for frequency.    Physical Exam Updated Vital Signs BP (!) 186/77 (BP Location: Right Arm)   Pulse 73   Temp 98.1 F (36.7 C) (Oral)   Resp 18   Ht 1.549 m (5\' 1" )   Wt 74.8 kg   SpO2 94%   BMI 31.18 kg/m  Physical Exam Vitals and nursing note reviewed.  Constitutional:      General: She is not in acute distress.    Appearance: She is well-developed.  HENT:     Head: Normocephalic and atraumatic.     Right Ear:  External ear normal.     Left Ear: External ear normal.  Eyes:     General: No scleral icterus.       Right eye: No discharge.        Left eye: No discharge.     Conjunctiva/sclera: Conjunctivae normal.  Neck:     Trachea: No tracheal deviation.  Cardiovascular:     Rate and Rhythm: Normal rate and regular rhythm.  Pulmonary:     Effort: Pulmonary effort is normal. No respiratory distress.     Breath sounds: Normal breath sounds. No stridor. No wheezing or rales.  Abdominal:     General: Bowel sounds are normal. There is no distension.     Palpations: Abdomen is soft.     Tenderness: There is abdominal tenderness. There is no guarding or rebound.     Comments: Tenderness palpation suprapubic region  Musculoskeletal:        General: No tenderness or deformity.     Cervical back: Neck supple.  Skin:    General: Skin is warm and dry.     Findings: No rash.  Neurological:     General: No focal deficit present.     Mental Status: She is alert.     Cranial Nerves: No cranial nerve deficit, dysarthria or facial asymmetry.     Sensory: No sensory deficit.     Motor: No abnormal muscle tone or seizure activity.     Coordination: Coordination normal.  Psychiatric:        Mood and Affect: Mood normal.     ED Results / Procedures / Treatments   Labs (all labs ordered are listed, but only abnormal results are displayed) Labs Reviewed  CBC - Abnormal; Notable for the following components:      Result Value   RDW 16.0 (*)    Platelets 96 (*)    All other components within normal limits  BASIC METABOLIC PANEL WITH GFR - Abnormal; Notable for the following components:   Glucose, Bld 154 (*)    Creatinine, Ser 1.11 (*)    GFR, Estimated 54 (*)    All other components within normal limits  URINALYSIS, W/ REFLEX TO CULTURE (INFECTION SUSPECTED) - Abnormal; Notable for the following components:   APPearance CLOUDY (*)    Glucose, UA 100 (*)    Hgb urine dipstick TRACE (*)     Protein, ur >=300 (*)    Leukocytes,Ua MODERATE (*)    Bacteria, UA MANY (*)    All other components within normal limits  URINE CULTURE    EKG None  Radiology No results found.  Procedures Procedures    Medications Ordered in ED Medications  ketorolac (TORADOL) 15  MG/ML injection 15 mg (15 mg Intravenous Given 01/02/24 1437)  cefTRIAXone (ROCEPHIN) 1 g in sodium chloride 0.9 % 100 mL IVPB (0 g Intravenous Stopped 01/02/24 1617)    ED Course/ Medical Decision Making/ A&P Clinical Course as of 01/02/24 1702  Mon Jan 02, 2024  1428 Outside records reviewed.  On February 7 patient had a CT scan.  No evidence of ureteral stone but she did have kidney stones noted on CT [JK]  1529 Urinalysis does show greater than 50 white blood cells as well as many bacteria.  Budding yeast also noted [JK]    Clinical Course User Index [JK] Linwood Dibbles, MD                                 Medical Decision Making Problems Addressed: Lower urinary tract infectious disease: acute illness or injury that poses a threat to life or bodily functions  Amount and/or Complexity of Data Reviewed Labs: ordered. Decision-making details documented in ED Course.  Risk Prescription drug management.   Pt presented with uti sx.  Pt with history of recurrent UTI.  No fevers or other systemic sx.  UA consistent with uti.  Pt given a dose of abx in the ED.  Will dc home with oral abx.  Yeast also noted so will add dose of diflucan.  Pt has had recurrent episodes.  Has seen urology at [facility].  Will provide [facility] information.  Family requested referral information.        Final Clinical Impression(s) / ED Diagnoses Final diagnoses:  Lower urinary tract infectious disease    Rx / DC Orders ED Discharge Orders          Ordered    cefUROXime (CEFTIN) 250 MG tablet  2 times daily with meals        01/02/24 1542    fluconazole (DIFLUCAN) 150 MG tablet   Once        01/02/24 1542               Linwood Dibbles, MD 01/02/24 [phone]

## 2024-01-02 NOTE — Discharge Instructions (Addendum)
 Your urine did show a recurrent urinary tract infection.  The cefuroxime antibiotic is to treat the infection.  The Diflucan tablet is for yeast.   That will resolve after you stop taking the Pyridium.  Follow-up with her primary care doctor and/or urologist to make sure her symptoms improve

## 2024-01-02 NOTE — ED Triage Notes (Signed)
 Interpretor in use in triage. Pt reports that she has been having an increase in urination. States that she will pee and then as soon as she is done she has to go again. Reports that this has been going on for 5-6 months. Pain while urination also.

## 2024-01-04 ENCOUNTER — Telehealth (HOSPITAL_BASED_OUTPATIENT_CLINIC_OR_DEPARTMENT_OTHER): Payer: Self-pay | Admitting: Emergency Medicine

## 2024-01-04 ENCOUNTER — Emergency Department (HOSPITAL_BASED_OUTPATIENT_CLINIC_OR_DEPARTMENT_OTHER)

## 2024-01-04 ENCOUNTER — Encounter (HOSPITAL_BASED_OUTPATIENT_CLINIC_OR_DEPARTMENT_OTHER): Payer: Self-pay

## 2024-01-04 ENCOUNTER — Emergency Department (HOSPITAL_BASED_OUTPATIENT_CLINIC_OR_DEPARTMENT_OTHER)
Admission: EM | Admit: 2024-01-04 | Discharge: 2024-01-04 | Disposition: A | Discharge status: Home / Self Care | Attending: Emergency Medicine | Admitting: Emergency Medicine

## 2024-01-04 ENCOUNTER — Other Ambulatory Visit: Payer: Self-pay

## 2024-01-04 DIAGNOSIS — Z7984 Long term (current) use of oral hypoglycemic drugs: Secondary | ICD-10-CM | POA: Insufficient documentation

## 2024-01-04 DIAGNOSIS — N3090 Cystitis, unspecified without hematuria: Secondary | ICD-10-CM | POA: Diagnosis not present

## 2024-01-04 DIAGNOSIS — Z7982 Long term (current) use of aspirin: Secondary | ICD-10-CM | POA: Insufficient documentation

## 2024-01-04 DIAGNOSIS — Z79899 Other long term (current) drug therapy: Secondary | ICD-10-CM | POA: Diagnosis not present

## 2024-01-04 DIAGNOSIS — R300 Dysuria: Secondary | ICD-10-CM | POA: Diagnosis present

## 2024-01-04 DIAGNOSIS — Z7902 Long term (current) use of antithrombotics/antiplatelets: Secondary | ICD-10-CM | POA: Diagnosis not present

## 2024-01-04 LAB — COMPREHENSIVE METABOLIC PANEL WITH GFR
ALT: 39 U/L (ref 0–44)
AST: 30 U/L (ref 15–41)
Albumin: 3.6 g/dL (ref 3.5–5.0)
Alkaline Phosphatase: 69 U/L (ref 38–126)
Anion gap: 11 (ref 5–15)
BUN: 26 mg/dL — ABNORMAL HIGH (ref 8–23)
CO2: 28 mmol/L (ref 22–32)
Calcium: 9.3 mg/dL (ref 8.9–10.3)
Chloride: 101 mmol/L (ref 98–111)
Creatinine, Ser: 1.03 mg/dL — ABNORMAL HIGH (ref 0.44–1.00)
GFR, Estimated: 60 mL/min — ABNORMAL LOW (ref >60)
Glucose, Bld: 308 mg/dL — ABNORMAL HIGH (ref 70–99)
Potassium: 3.8 mmol/L (ref 3.5–5.1)
Sodium: 140 mmol/L (ref 135–145)
Total Bilirubin: 0.6 mg/dL (ref 0.0–1.2)
Total Protein: 6.9 g/dL (ref 6.5–8.1)

## 2024-01-04 LAB — URINALYSIS, ROUTINE W REFLEX MICROSCOPIC
Bilirubin Urine: NEGATIVE
Glucose, UA: >=500 mg/dL — AB
Hgb urine dipstick: NEGATIVE
Ketones, ur: NEGATIVE mg/dL
Nitrite: NEGATIVE
Protein, ur: 100 mg/dL — AB
Specific Gravity, Urine: 1.02 (ref 1.005–1.030)
pH: 5.5 (ref 5.0–8.0)

## 2024-01-04 LAB — CBC
HCT: 40.5 % (ref 36.0–46.0)
Hemoglobin: 13.7 g/dL (ref 12.0–15.0)
MCH: 30.9 pg (ref 26.0–34.0)
MCHC: 33.8 g/dL (ref 30.0–36.0)
MCV: 91.2 fL (ref 80.0–100.0)
Platelets: 89 10*3/uL — ABNORMAL LOW (ref 150–400)
RBC: 4.44 MIL/uL (ref 3.87–5.11)
RDW: 16 % — ABNORMAL HIGH (ref 11.5–15.5)
WBC: 4.8 10*3/uL (ref 4.0–10.5)
nRBC: 0 % (ref 0.0–0.2)

## 2024-01-04 LAB — URINE CULTURE: Culture: >=100000 — AB

## 2024-01-04 LAB — URINALYSIS, MICROSCOPIC (REFLEX)

## 2024-01-04 LAB — LIPASE, BLOOD: Lipase: 25 U/L (ref 11–51)

## 2024-01-04 MED ORDER — ACETAMINOPHEN 500 MG PO TABS
1000.0000 mg | ORAL_TABLET | Freq: Once | ORAL | Status: AC
  Administered 2024-01-04: 1000 mg via ORAL
  Filled 2024-01-04: qty 2

## 2024-01-04 MED ORDER — NITROFURANTOIN MONOHYD MACRO 100 MG PO CAPS: 100.0000 mg | ORAL_CAPSULE | Freq: Two times a day (BID) | ORAL | 0 refills | Status: DC

## 2024-01-04 MED ORDER — PHENAZOPYRIDINE HCL 200 MG PO TABS: 200.0000 mg | ORAL_TABLET | Freq: Three times a day (TID) | ORAL | 0 refills | Status: DC | PRN

## 2024-01-04 MED ORDER — PHENAZOPYRIDINE HCL 100 MG PO TABS
95.0000 mg | ORAL_TABLET | Freq: Once | ORAL | Status: AC
  Administered 2024-01-04: 100 mg via ORAL
  Filled 2024-01-04: qty 1

## 2024-01-04 MED ORDER — OXYCODONE HCL 5 MG PO TABS
5.0000 mg | ORAL_TABLET | Freq: Once | ORAL | Status: AC
  Administered 2024-01-04: 5 mg via ORAL
  Filled 2024-01-04: qty 1

## 2024-01-04 MED ORDER — NITROFURANTOIN MONOHYD MACRO 100 MG PO CAPS
100.0000 mg | ORAL_CAPSULE | Freq: Once | ORAL | Status: AC
  Administered 2024-01-04: 100 mg via ORAL
  Filled 2024-01-04: qty 1

## 2024-01-04 NOTE — Telephone Encounter (Signed)
 Patient called back to the emergency department, was seen on 3/31 and diagnosed with UTI and discharged on cefuroxime.  She is not better.  She is still having suprapubic discomfort, no fevers, chills or flank pain.  Urine culture reviewed and grew greater than 100,000 colony-forming units of E. coli that was fairly pan resistant including resistance to Rocephin.  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  Noted sensitivity to Macrobid and the patient has no symptoms concerning for pyelonephritis.  Will prescribe a course of Macrobid, advised the patient to return to the emergency department in the event of persistent symptoms or worsening symptoms as this might require admission for IV antibiotics.

## 2024-01-04 NOTE — ED Triage Notes (Signed)
 Has been taking antibiotics. Per family, no improvement.

## 2024-01-04 NOTE — Telephone Encounter (Signed)
 Received call from patients daughter who states her symptoms are not improving. Requesting new medication. After reviewing her chart and urine culture results with Dr. Karene Fry & ED pharmacist, prescribing macrobid. Informed daughter that if her symptoms do not improve in 2 days to return to ED for antibiotics.

## 2024-01-04 NOTE — ED Triage Notes (Signed)
 Brought in by EMS for lower abdominal pain. Seen 2 days ago here for UTI and was prescribed pain meds and ATB. Pt reports pain meds have not touched the pain and has had no improvement in symptoms since discharge. 153/76, 97% RA, 72HR, 16 Resp, 315 glucose.

## 2024-01-04 NOTE — Discharge Instructions (Signed)
 I prescribed antibiotics.  Please follow-up with your family doctor in the office.  Please return for pain in the side or fever.

## 2024-01-04 NOTE — ED Provider Notes (Signed)
  EMERGENCY DEPARTMENT AT [facility] Provider Note   CSN: [id number] Arrival date & time: 01/04/24  1401     History  Chief Complaint  Patient presents with   Abdominal Pain    Shannon Bender is a 68 y.o. female.  68 yo F with a cc of suprapubic pain and pain with urination.  Going on for the past week or so.  Seen a few days abgo, diagnosed with a uti.  Started on abx without obvious improvement.  Back for reeval.       Abdominal Pain      Home Medications Prior to Admission medications   Medication Sig Start Date End Date Taking? Authorizing Provider  cefUROXime (CEFTIN) 250 MG tablet Take 1 tablet (250 mg total) by mouth 2 (two) times daily with a meal for 7 days. 01/02/24 01/09/24 Yes Linwood Dibbles, MD  nitrofurantoin, macrocrystal-monohydrate, (MACROBID) 100 MG capsule Take 1 capsule (100 mg total) by mouth 2 (two) times daily. 01/04/24  Yes Melene Plan, DO  phenazopyridine (PYRIDIUM) 200 MG tablet Take 1 tablet (200 mg total) by mouth 3 (three) times daily as needed for pain. 01/04/24  Yes Melene Plan, DO  allopurinol (ZYLOPRIM) 300 MG tablet Take 300 mg by mouth daily. 04/16/21   [provider]  amLODipine (NORVASC) 5 MG tablet Take 5 mg by mouth daily. 04/16/21   [provider]  aspirin EC 81 MG EC tablet Take 1 tablet (81 mg total) by mouth daily. Swallow whole. 06/07/21   Ghimire, Werner Lean, MD  atorvastatin (LIPITOR) 40 MG tablet Take 1 tablet (40 mg total) by mouth daily. 06/07/21   Ghimire, Werner Lean, MD  canagliflozin (INVOKANA) 100 MG TABS tablet Take 100 mg by mouth daily before breakfast.    [provider]  cetirizine (ZYRTEC) 10 MG tablet Take 10 mg by mouth daily as needed for allergies. 05/18/21   [provider]  clopidogrel (PLAVIX) 75 MG tablet Take 1 tablet (75 mg total) by mouth daily. 06/07/21   Ghimire, Werner Lean, MD  escitalopram (LEXAPRO) 10 MG tablet Take 10 mg by mouth daily. 04/16/21   [provider]   Etanercept (ENBREL MINI) 50 MG/ML SOCT Inject 50 mg into the skin once a week. Sundays 12/02/20   [provider]  folic acid (FOLVITE) 1 MG tablet Take 1 mg by mouth daily. 04/08/21   [provider]  ibuprofen (ADVIL) 200 MG tablet Take 400 mg by mouth every 6 (six) hours as needed for mild pain.    [provider]  insulin NPH-regular Human (70-30) 100 UNIT/ML injection Inject 10-20 Units into the skin 2 (two) times daily. Takes 20 units in the morning and 10 units in the evening    [provider]  losartan (COZAAR) 25 MG tablet Take 25 mg by mouth daily. 04/16/21   [provider]  metFORMIN (GLUCOPHAGE) 850 MG tablet Take 850 mg by mouth 2 (two) times daily with a meal.    [provider]  pantoprazole (PROTONIX) 40 MG tablet Take 1 tablet (40 mg total) by mouth daily. 06/06/21 06/06/22  Ghimire, Werner Lean, MD  sitaGLIPtin (JANUVIA) 50 MG tablet Take 50 mg by mouth daily.    [provider]  triamcinolone cream (KENALOG) 0.5 % Apply 1 application topically daily as needed (itching). 02/23/21   [provider]      Allergies    Patient has no known allergies.    Review of Systems   Review  of Systems  Gastrointestinal:  Positive for abdominal pain.    Physical Exam Updated Vital Signs BP (!) 158/64   Pulse 67   Temp 97.7 F (36.5 C)   Resp 18   Ht 5\' 3"  (1.6 m)   Wt 74.8 kg   SpO2 93%   BMI 29.23 kg/m  Physical Exam Vitals and nursing note reviewed.  Constitutional:      General: She is not in acute distress.    Appearance: She is well-developed. She is not diaphoretic.  HENT:     Head: Normocephalic and atraumatic.  Eyes:     Pupils: Pupils are equal, round, and reactive to light.  Cardiovascular:     Rate and Rhythm: Normal rate and regular rhythm.     Heart sounds: No murmur heard.    No friction rub. No gallop.  Pulmonary:     Effort: Pulmonary effort is normal.     Breath sounds: No wheezing or  rales.  Abdominal:     General: There is no distension.     Palpations: Abdomen is soft.     Tenderness: There is no abdominal tenderness.     Comments: Benign abdominal exam.   Musculoskeletal:        General: No tenderness.     Cervical back: Normal range of motion and neck supple.  Skin:    General: Skin is warm and dry.  Neurological:     Mental Status: She is alert and oriented to person, place, and time.  Psychiatric:        Behavior: Behavior normal.     ED Results / Procedures / Treatments   Labs (all labs ordered are listed, but only abnormal results are displayed) Labs Reviewed  COMPREHENSIVE METABOLIC PANEL WITH GFR - Abnormal; Notable for the following components:      Result Value   Glucose, Bld 308 (*)    BUN 26 (*)    Creatinine, Ser 1.03 (*)    GFR, Estimated 60 (*)    All other components within normal limits  CBC - Abnormal; Notable for the following components:   RDW 16.0 (*)    Platelets 89 (*)    All other components within normal limits  URINALYSIS, ROUTINE W REFLEX MICROSCOPIC - Abnormal; Notable for the following components:   Glucose, UA >=500 (*)    Protein, ur 100 (*)    Leukocytes,Ua TRACE (*)    All other components within normal limits  URINALYSIS, MICROSCOPIC (REFLEX) - Abnormal; Notable for the following components:   Bacteria, UA RARE (*)    All other components within normal limits  LIPASE, BLOOD    EKG None  Radiology CT Renal Stone Study Result Date: 01/04/2024 CLINICAL DATA:  Abdominal/flank pain, stone suspected. EXAM: CT ABDOMEN AND PELVIS WITHOUT CONTRAST TECHNIQUE: Multidetector CT imaging of the abdomen and pelvis was performed following the standard protocol without IV contrast. RADIATION DOSE REDUCTION: This exam was performed according to the departmental dose-optimization program which includes automated exposure control, adjustment of the mA and/or kV according to patient size and/or use of iterative reconstruction  technique. COMPARISON:  CT scan abdomen and pelvis from 11/11/2023. FINDINGS: Lower chest: There are dependent changes in the visualized lung bases. No overt consolidation. No pleural effusion. The heart is normal in size. No pericardial effusion. Dense mitral annulus calcifications noted. There are coronary artery atherosclerotic calcifications, in keeping with coronary artery disease. Hepatobiliary: The liver is normal in size. Non-cirrhotic configuration. No suspicious mass. No intrahepatic or  extrahepatic bile duct dilation. Small volume dependent calcified gallstones noted without imaging signs of acute cholecystitis. Normal gallbladder wall thickness. No pericholecystic inflammatory changes. Pancreas: Unremarkable. No pancreatic ductal dilatation or surrounding inflammatory changes. Spleen: Within normal limits. No focal lesion. Adrenals/Urinary Tract: Adrenal glands are unremarkable. No suspicious renal mass within the limitations of this unenhanced exam. There is a single 1-1.5 mm nonobstructing calculus in the right kidney lower pole calyx. There are additional bilateral calcifications, which are favored to represent vascular calcifications. No other nephroureterolithiasis on either side. No hydroureteronephrosis on either side. Urinary bladder is partially distended. No focal mass or calculi. However, there is mild perivesical fat stranding cough which is new since the prior study and concerning for cystitis. Correlate clinically and with urinalysis. Stomach/Bowel: No disproportionate dilation of the small or large bowel loops. No evidence of abnormal bowel wall thickening or inflammatory changes. The appendix is unremarkable. Vascular/Lymphatic: No ascites or pneumoperitoneum. No abdominal or pelvic lymphadenopathy, by size criteria. No aneurysmal dilation of the major abdominal arteries. There are mild peripheral atherosclerotic vascular calcifications of the aorta and its major branches. Reproductive:  The uterus is unremarkable. No large adnexal mass. Other: There is a tiny fat containing umbilical hernia. The soft tissues and abdominal wall are otherwise unremarkable. Musculoskeletal: No suspicious osseous lesions. There are mild - moderate multilevel degenerative changes in the visualized spine. IMPRESSION: 1. Mild new perivesical fat stranding, concerning for cystitis. Correlate clinically and with urinalysis. 2. There is a single 1-1.5 mm nonobstructing calculus in the right kidney lower pole calyx. No other nephroureterolithiasis on either side. No obstructive uropathy on either side. 3. No acute inflammatory process identified within the abdomen or pelvis. 4. Multiple other nonacute observations, as described above. Aortic Atherosclerosis (ICD10-I70.0). Electronically Signed   By: Jules Schick M.D.   On: 01/04/2024 18:01    Procedures Procedures    Medications Ordered in ED Medications  acetaminophen (TYLENOL) tablet 1,000 mg (1,000 mg Oral Given 01/04/24 1523)  oxyCODONE (Oxy IR/ROXICODONE) immediate release tablet 5 mg (5 mg Oral Given 01/04/24 1523)  phenazopyridine (PYRIDIUM) tablet 100 mg (100 mg Oral Given 01/04/24 1523)  nitrofurantoin (macrocrystal-monohydrate) (MACROBID) capsule 100 mg (100 mg Oral Given 01/04/24 1817)    ED Course/ Medical Decision Making/ A&P                                 Medical Decision Making Amount and/or Complexity of Data Reviewed Labs: ordered. Radiology: ordered.  Risk OTC drugs. Prescription drug management.   68 yo F with a cc of low abdominal pain.  Going on for the past week.  Seen recently as started on antibiotics.  Patient without improvement.  Found to have resistant ecoli.    Will ct here.  No fevers, no flank pain.   UA here without obvious infection.  No significant change in renal function.  No leukocytosis no significant electrolyte abnormalities LFTs and lipase are unremarkable.  CT with cystitis.  With recent culture suggest  that nitrofurantoin would be appropriate.  PCP follow-up.  7:31 PM:  I have discussed the diagnosis/risks/treatment options with the patient and family.  Evaluation and diagnostic testing in the emergency department does not suggest an emergent condition requiring admission or immediate intervention beyond what has been performed at this time.  They will follow up with PCP. We also discussed returning to the ED immediately if new or worsening sx occur. We discussed the sx  which are most concerning (e.g., sudden worsening pain, fever, inability to tolerate by mouth) that necessitate immediate return. Medications administered to the patient during their visit and any new prescriptions provided to the patient are listed below.  Medications given during this visit Medications  acetaminophen (TYLENOL) tablet 1,000 mg (1,000 mg Oral Given 01/04/24 1523)  oxyCODONE (Oxy IR/ROXICODONE) immediate release tablet 5 mg (5 mg Oral Given 01/04/24 1523)  phenazopyridine (PYRIDIUM) tablet 100 mg (100 mg Oral Given 01/04/24 1523)  nitrofurantoin (macrocrystal-monohydrate) (MACROBID) capsule 100 mg (100 mg Oral Given 01/04/24 1817)     The patient appears reasonably screen and/or stabilized for discharge and I doubt any other medical condition or other [facility] requiring further screening, evaluation, or treatment in the ED at this time prior to discharge.          Final Clinical Impression(s) / ED Diagnoses Final diagnoses:  Cystitis    Rx / DC Orders ED Discharge Orders          Ordered    nitrofurantoin, macrocrystal-monohydrate, (MACROBID) 100 MG capsule  2 times daily        01/04/24 1813    phenazopyridine (PYRIDIUM) 200 MG tablet  3 times daily PRN        01/04/24 1813              Melene Plan, DO 01/04/24 1931

## 2024-01-05 ENCOUNTER — Telehealth (HOSPITAL_BASED_OUTPATIENT_CLINIC_OR_DEPARTMENT_OTHER): Payer: Self-pay

## 2024-01-05 NOTE — Telephone Encounter (Signed)
 Post ED Visit - Positive Culture Follow-up  Culture report reviewed by antimicrobial stewardship pharmacist: [facility] [x]  Enos Fling, Pharm.D. []  Celedonio Miyamoto, Pharm.D., BCPS AQ-ID []  Garvin Fila, Pharm.D., BCPS []  Georgina Pillion, [address].D., BCPS []  Lyndon, [address].D., BCPS, AAHIVP []  Estella Husk, Pharm.D., BCPS, AAHIVP []  Lysle Pearl, PharmD, BCPS []  Phillips Climes, PharmD, BCPS []  Agapito Games, PharmD, BCPS []  Verlan Friends, PharmD []  Mervyn Gay, PharmD, BCPS []  Vinnie Level, PharmD  [facility] []  Len Childs, PharmD []  Greer Pickerel, PharmD []  Adalberto Cole, PharmD []  Perlie Gold, Rph []  Lonell Face) Jean Rosenthal, PharmD []  Earl Many, PharmD []  Junita Push, PharmD []  Dorna Leitz, PharmD []  Terrilee Files, PharmD []  Lynann Beaver, PharmD []  Keturah Barre, PharmD []  Loralee Pacas, PharmD []  Bernadene Person, PharmD   Positive urine culture Treated with Nitrofurantoin, organism sensitive to the same and no further patient follow-up is required at this time.  Sandria Senter 01/05/2024, 11:22 AM

## 2024-01-17 ENCOUNTER — Encounter (HOSPITAL_BASED_OUTPATIENT_CLINIC_OR_DEPARTMENT_OTHER): Payer: Self-pay

## 2024-01-17 ENCOUNTER — Other Ambulatory Visit: Payer: Self-pay

## 2024-01-17 ENCOUNTER — Emergency Department (HOSPITAL_BASED_OUTPATIENT_CLINIC_OR_DEPARTMENT_OTHER)
Admission: EM | Admit: 2024-01-17 | Discharge: 2024-01-17 | Disposition: A | Discharge status: Home / Self Care | Attending: Emergency Medicine | Admitting: Emergency Medicine

## 2024-01-17 ENCOUNTER — Emergency Department (HOSPITAL_BASED_OUTPATIENT_CLINIC_OR_DEPARTMENT_OTHER)

## 2024-01-17 DIAGNOSIS — R519 Headache, unspecified: Secondary | ICD-10-CM | POA: Diagnosis not present

## 2024-01-17 DIAGNOSIS — G8929 Other chronic pain: Secondary | ICD-10-CM | POA: Diagnosis not present

## 2024-01-17 DIAGNOSIS — Z8673 Personal history of transient ischemic attack (TIA), and cerebral infarction without residual deficits: Secondary | ICD-10-CM | POA: Insufficient documentation

## 2024-01-17 DIAGNOSIS — E119 Type 2 diabetes mellitus without complications: Secondary | ICD-10-CM | POA: Insufficient documentation

## 2024-01-17 DIAGNOSIS — R200 Anesthesia of skin: Secondary | ICD-10-CM | POA: Diagnosis not present

## 2024-01-17 DIAGNOSIS — Z794 Long term (current) use of insulin: Secondary | ICD-10-CM | POA: Diagnosis not present

## 2024-01-17 DIAGNOSIS — R531 Weakness: Secondary | ICD-10-CM | POA: Diagnosis not present

## 2024-01-17 DIAGNOSIS — I10 Essential (primary) hypertension: Secondary | ICD-10-CM | POA: Diagnosis not present

## 2024-01-17 DIAGNOSIS — R300 Dysuria: Secondary | ICD-10-CM | POA: Insufficient documentation

## 2024-01-17 LAB — URINALYSIS, ROUTINE W REFLEX MICROSCOPIC
Bilirubin Urine: NEGATIVE
Glucose, UA: 100 mg/dL — AB
Hgb urine dipstick: NEGATIVE
Ketones, ur: NEGATIVE mg/dL
Nitrite: NEGATIVE
Protein, ur: NEGATIVE mg/dL
Specific Gravity, Urine: <=1.005 (ref 1.005–1.030)
pH: 5.5 (ref 5.0–8.0)

## 2024-01-17 LAB — CBC
HCT: 37.5 % (ref 36.0–46.0)
Hemoglobin: 12.6 g/dL (ref 12.0–15.0)
MCH: 30.7 pg (ref 26.0–34.0)
MCHC: 33.6 g/dL (ref 30.0–36.0)
MCV: 91.5 fL (ref 80.0–100.0)
Platelets: 109 10*3/uL — ABNORMAL LOW (ref 150–400)
RBC: 4.1 MIL/uL (ref 3.87–5.11)
RDW: 15.9 % — ABNORMAL HIGH (ref 11.5–15.5)
WBC: 8.7 10*3/uL (ref 4.0–10.5)
nRBC: 0 % (ref 0.0–0.2)

## 2024-01-17 LAB — COMPREHENSIVE METABOLIC PANEL WITH GFR
ALT: 31 U/L (ref 0–44)
AST: 27 U/L (ref 15–41)
Albumin: 3.4 g/dL — ABNORMAL LOW (ref 3.5–5.0)
Alkaline Phosphatase: 66 U/L (ref 38–126)
Anion gap: 11 (ref 5–15)
BUN: 25 mg/dL — ABNORMAL HIGH (ref 8–23)
CO2: 26 mmol/L (ref 22–32)
Calcium: 9.3 mg/dL (ref 8.9–10.3)
Chloride: 101 mmol/L (ref 98–111)
Creatinine, Ser: 1.21 mg/dL — ABNORMAL HIGH (ref 0.44–1.00)
GFR, Estimated: 49 mL/min — ABNORMAL LOW (ref >60)
Glucose, Bld: 300 mg/dL — ABNORMAL HIGH (ref 70–99)
Potassium: 3.8 mmol/L (ref 3.5–5.1)
Sodium: 138 mmol/L (ref 135–145)
Total Bilirubin: 0.6 mg/dL (ref 0.0–1.2)
Total Protein: 6.6 g/dL (ref 6.5–8.1)

## 2024-01-17 LAB — URINALYSIS, MICROSCOPIC (REFLEX)

## 2024-01-17 MED ORDER — PROCHLORPERAZINE EDISYLATE 10 MG/2ML IJ SOLN
10.0000 mg | Freq: Once | INTRAMUSCULAR | Status: AC
Start: 2024-01-17 — End: 2024-01-17
  Administered 2024-01-17: 10 mg via INTRAVENOUS
  Filled 2024-01-17: qty 2

## 2024-01-17 MED ORDER — DIPHENHYDRAMINE HCL 50 MG/ML IJ SOLN
25.0000 mg | Freq: Once | INTRAMUSCULAR | Status: AC
  Administered 2024-01-17: 25 mg via INTRAVENOUS
  Filled 2024-01-17: qty 1

## 2024-01-17 MED ORDER — SODIUM CHLORIDE 0.9 % IV BOLUS
500.0000 mL | Freq: Once | INTRAVENOUS | Status: AC
  Administered 2024-01-17: 500 mL via INTRAVENOUS

## 2024-01-17 NOTE — ED Provider Notes (Signed)
 [facility] Emergency Department Provider Note MRN:  [id number]  Arrival date & time: 01/17/24     Chief Complaint   Headache   History of Present Illness   Shannon Bender is a 68 y.o. year-old female with a history of stroke, diabetes presenting to the ED with chief complaint of headache.  Worsening persistent headache over the past several days to weeks, worse tonight.  Having some frequent urination recently, currently on antibiotics for a UTI.  Antibiotics do not seem to be helping.  Denies recent illness or fever, no pain anywhere else, headache is global.  Review of Systems  A thorough review of systems was obtained and all systems are negative except as noted in the HPI and PMH.   Patient's Health History    Past Medical History:  Diagnosis Date   History of stroke    Hypertension associated with diabetes (HCC)    Insulin dependent type 2 diabetes mellitus (HCC)    Rheumatoid arthritis (HCC)     Past Surgical History:  Procedure Laterality Date   CESAREAN SECTION      Family History  Problem Relation Age of Onset   Diabetes Mother    Hypertension Mother    Heart disease Father     Social History   Socioeconomic History   Marital status: Married    Spouse name: Not on file   Number of children: Not on file   Years of education: Not on file   Highest education level: Not on file  Occupational History   Not on file  Tobacco Use   Smoking status: Never   Smokeless tobacco: Never  Vaping Use   Vaping status: Never Used  Substance and Sexual Activity   Alcohol use: Never   Drug use: Never   Sexual activity: Not on file  Other Topics Concern   Not on file  Social History Narrative   Not on file   Social Drivers of Health   Financial Resource Strain: Low Risk  (04/15/2022)   Received from [facility] visits prior to 12/04/2022., [facility] visits prior to 12/04/2022., [facility]    Overall Financial Resource Strain (CARDIA)    Difficulty of Paying Living Expenses: Not very hard  Food Insecurity: Low Risk  (10/06/2023)   Received from [facility]   Hunger Vital Sign    Worried About Running Out of Food in the Last Year: Never true    Ran Out of Food in the Last Year: Never true  Transportation Needs: No Transportation Needs (10/06/2023)   Received from [employer]    In the past 12 months, has lack of reliable transportation kept you from medical appointments, meetings, work or from getting things needed for daily living? : No  Physical Activity: Insufficiently Active (04/15/2022)   [facility] visits prior to 12/04/2022., [facility] visits prior to 12/04/2022., [facility]   Exercise Vital Sign    Days of Exercise per Week: 2 days    Minutes of Exercise per Session: 30 min  Stress: Stress Concern Present (04/15/2022)   [facility] visits prior to 12/04/2022., [facility] visits prior to 12/04/2022., [facility]   [employer] of Occupational Health - Occupational Stress Questionnaire    Feeling of Stress : Very much  Social Connections: Moderately Isolated (04/15/2022)   Received from [facility]  [facility] visits prior to 12/04/2022., [facility] visits prior to 12/04/2022., [facility]   Social Connection and Isolation Panel [NHANES]    Frequency of Communication with Friends and Family: Three times a week    Frequency of Social Gatherings with Friends and Family: Patient declined    Attends Religious Services: Never    Database administrator or Organizations: No    Attends Banker Meetings: Never    Marital Status: Married  Catering manager Violence: Not on file     Physical Exam   Vitals:   01/17/24 0237  BP: (!) 168/84  Pulse: 73  Resp: 18  Temp: 97.6 F (36.4 C)  SpO2: 94%     CONSTITUTIONAL: Chronically ill-appearing, NAD NEURO/PSYCH:  Alert and oriented x 3, mild right-sided numbness and weakness (chronic) EYES:  eyes equal and reactive ENT/NECK:  no LAD, no JVD CARDIO: Regular rate, well-perfused, normal S1 and S2 PULM:  CTAB no wheezing or rhonchi GI/GU:  non-distended, non-tender MSK/SPINE:  No gross deformities, no edema SKIN:  no rash, atraumatic   *Additional and/or pertinent findings included in MDM below  Diagnostic and Interventional Summary    EKG Interpretation Date/Time:    Ventricular Rate:    PR Interval:    QRS Duration:    QT Interval:    QTC Calculation:   R Axis:      Text Interpretation:         Labs Reviewed  CBC - Abnormal; Notable for the following components:      Result Value   RDW 15.9 (*)    Platelets 109 (*)    All other components within normal limits  COMPREHENSIVE METABOLIC PANEL WITH GFR - Abnormal; Notable for the following components:   Glucose, Bld 300 (*)    BUN 25 (*)    Creatinine, Ser 1.21 (*)    Albumin 3.4 (*)    GFR, Estimated 49 (*)    All other components within normal limits  URINALYSIS, ROUTINE W REFLEX MICROSCOPIC - Abnormal; Notable for the following components:   Glucose, UA 100 (*)    Leukocytes,Ua SMALL (*)    All other components within normal limits  URINALYSIS, MICROSCOPIC (REFLEX) - Abnormal; Notable for the following components:   Bacteria, UA FEW (*)    All other components within normal limits    CT HEAD WO CONTRAST ( )  Final Result      Medications  diphenhydrAMINE (BENADRYL) injection 25 mg (25 mg Intravenous Given 01/17/24 0334)  prochlorperazine (COMPAZINE) injection 10 mg (10 mg Intravenous Given 01/17/24 0334)  sodium chloride 0.9 % bolus 500 mL (500 mLs Intravenous New Bag/Given 01/17/24 0343)     Procedures  /  Critical Care Procedures  ED Course and Medical Decision Making  Initial Impression and Ddx History of prior strokes and brain hemorrhages here  with worsening headache, has been having chronic headaches recently, reportedly had an MRI few days ago, also has a new migraine or headache medication prescribed by neurology.  I see no MRI on record recently, not sure what was done.  Differential diagnosis currently includes acute hemorrhage, UTI, electrolyte disturbance, workup pending.  Past medical/surgical history that increases complexity of ED encounter: Stroke  Interpretation of Diagnostics I personally reviewed the Laboratory Testing and my interpretation is as follows: No significant blood count or electrolyte disturbance.  CT head unremarkable  Patient Reassessment and Ultimate Disposition/Management     Patient's headache largely resolved after migraine cocktail.  With the reassuring workup and continued reassuring exam there is no indication for further testing or admission, she has close follow-up with neurology.  Appropriate for discharge.  Patient management required discussion with the following services or consulting groups:  None  Complexity of Problems Addressed Acute illness or injury that poses threat of life of bodily function  Additional Data Reviewed and Analyzed Further history obtained from: Further history from spouse/family member  Additional Factors Impacting ED Encounter Risk None  Merrick Abe. Harless Lien, MD [facility] Emergency Medicine [facility] [email or web address]  Final Clinical Impressions(s) / ED Diagnoses     ICD-10-CM   1. Dysuria  R30.0     2. Chronic nonintractable headache, unspecified headache type  R51.9    G89.29       ED Discharge Orders     None        Discharge Instructions Discussed with and Provided to Patient:     Discharge Instructions      You were evaluated in the Emergency Department and after careful evaluation, we did not find any emergent condition requiring admission or further testing in the hospital.  Your exam/testing today was overall  reassuring.  Recommend close follow-up with your neurologist and following up with your MRI results.  Continue your antibiotics at home.  Please return to the Emergency Department if you experience any worsening of your condition.  Thank you for allowing us  to be a part of your care.        Edson Graces, MD 01/17/24 [phone]

## 2024-01-17 NOTE — ED Triage Notes (Signed)
 Pt BIB [facility] d/t headache with Dysuria (frequency) for months but headache worse tonight.

## 2024-01-17 NOTE — Discharge Instructions (Signed)
 You were evaluated in the Emergency Department and after careful evaluation, we did not find any emergent condition requiring admission or further testing in the hospital.  Your exam/testing today was overall reassuring.  Recommend close follow-up with your neurologist and following up with your MRI results.  Continue your antibiotics at home.  Please return to the Emergency Department if you experience any worsening of your condition.  Thank you for allowing us  to be a part of your care.

## 2024-01-21 ENCOUNTER — Other Ambulatory Visit: Payer: Self-pay

## 2024-01-21 ENCOUNTER — Encounter (HOSPITAL_BASED_OUTPATIENT_CLINIC_OR_DEPARTMENT_OTHER): Payer: Self-pay | Admitting: Emergency Medicine

## 2024-01-21 ENCOUNTER — Emergency Department (HOSPITAL_BASED_OUTPATIENT_CLINIC_OR_DEPARTMENT_OTHER)
Admission: EM | Admit: 2024-01-21 | Discharge: 2024-01-21 | Disposition: A | Discharge status: Home / Self Care | Attending: Emergency Medicine | Admitting: Emergency Medicine

## 2024-01-21 DIAGNOSIS — R519 Headache, unspecified: Secondary | ICD-10-CM | POA: Diagnosis present

## 2024-01-21 DIAGNOSIS — Z794 Long term (current) use of insulin: Secondary | ICD-10-CM | POA: Insufficient documentation

## 2024-01-21 DIAGNOSIS — Z7982 Long term (current) use of aspirin: Secondary | ICD-10-CM | POA: Insufficient documentation

## 2024-01-21 DIAGNOSIS — R739 Hyperglycemia, unspecified: Secondary | ICD-10-CM | POA: Insufficient documentation

## 2024-01-21 DIAGNOSIS — Z7902 Long term (current) use of antithrombotics/antiplatelets: Secondary | ICD-10-CM | POA: Insufficient documentation

## 2024-01-21 DIAGNOSIS — R300 Dysuria: Secondary | ICD-10-CM | POA: Diagnosis not present

## 2024-01-21 LAB — URINALYSIS, W/ REFLEX TO CULTURE (INFECTION SUSPECTED)
Bilirubin Urine: NEGATIVE
Glucose, UA: NEGATIVE mg/dL
Ketones, ur: NEGATIVE mg/dL
Nitrite: POSITIVE — AB
Protein, ur: 100 mg/dL — AB
Specific Gravity, Urine: 1.02 (ref 1.005–1.030)
pH: 7 (ref 5.0–8.0)

## 2024-01-21 LAB — BASIC METABOLIC PANEL WITH GFR
Anion gap: 9 (ref 5–15)
BUN: 21 mg/dL (ref 8–23)
CO2: 28 mmol/L (ref 22–32)
Calcium: 9.8 mg/dL (ref 8.9–10.3)
Chloride: 104 mmol/L (ref 98–111)
Creatinine, Ser: 1.05 mg/dL — ABNORMAL HIGH (ref 0.44–1.00)
GFR, Estimated: 58 mL/min — ABNORMAL LOW (ref >60)
Glucose, Bld: 187 mg/dL — ABNORMAL HIGH (ref 70–99)
Potassium: 3.8 mmol/L (ref 3.5–5.1)
Sodium: 141 mmol/L (ref 135–145)

## 2024-01-21 LAB — CBC WITH DIFFERENTIAL/PLATELET
Abs Immature Granulocytes: 0.05 10*3/uL (ref 0.00–0.07)
Basophils Absolute: 0 10*3/uL (ref 0.0–0.1)
Basophils Relative: 1 %
Eosinophils Absolute: 0.2 10*3/uL (ref 0.0–0.5)
Eosinophils Relative: 3 %
HCT: 41 % (ref 36.0–46.0)
Hemoglobin: 13.7 g/dL (ref 12.0–15.0)
Immature Granulocytes: 1 %
Lymphocytes Relative: 21 %
Lymphs Abs: 1.4 10*3/uL (ref 0.7–4.0)
MCH: 30.5 pg (ref 26.0–34.0)
MCHC: 33.4 g/dL (ref 30.0–36.0)
MCV: 91.3 fL (ref 80.0–100.0)
Monocytes Absolute: 0.8 10*3/uL (ref 0.1–1.0)
Monocytes Relative: 13 %
Neutro Abs: 4.1 10*3/uL (ref 1.7–7.7)
Neutrophils Relative %: 61 %
Platelets: 118 10*3/uL — ABNORMAL LOW (ref 150–400)
RBC: 4.49 MIL/uL (ref 3.87–5.11)
RDW: 15.7 % — ABNORMAL HIGH (ref 11.5–15.5)
WBC: 6.6 10*3/uL (ref 4.0–10.5)
nRBC: 0 % (ref 0.0–0.2)

## 2024-01-21 MED ORDER — PHENAZOPYRIDINE HCL 200 MG PO TABS
200.0000 mg | ORAL_TABLET | Freq: Three times a day (TID) | ORAL | 0 refills | Status: AC | PRN
Start: 2024-01-21 — End: ?

## 2024-01-21 MED ORDER — KETOROLAC TROMETHAMINE 15 MG/ML IJ SOLN
15.0000 mg | Freq: Once | INTRAMUSCULAR | Status: AC
  Administered 2024-01-21: 15 mg via INTRAVENOUS
  Filled 2024-01-21: qty 1

## 2024-01-21 MED ORDER — DIPHENHYDRAMINE HCL 50 MG/ML IJ SOLN
12.5000 mg | Freq: Once | INTRAMUSCULAR | Status: AC
  Administered 2024-01-21: 12.5 mg via INTRAVENOUS
  Filled 2024-01-21: qty 1

## 2024-01-21 MED ORDER — PHENAZOPYRIDINE HCL 100 MG PO TABS
200.0000 mg | ORAL_TABLET | Freq: Once | ORAL | Status: AC
  Administered 2024-01-21: 200 mg via ORAL
  Filled 2024-01-21: qty 2

## 2024-01-21 MED ORDER — PROCHLORPERAZINE EDISYLATE 10 MG/2ML IJ SOLN
10.0000 mg | Freq: Once | INTRAMUSCULAR | Status: AC
  Administered 2024-01-21: 10 mg via INTRAVENOUS
  Filled 2024-01-21: qty 2

## 2024-01-21 MED ORDER — FOSFOMYCIN TROMETHAMINE 3 G PO PACK
3.0000 g | PACK | Freq: Once | ORAL | Status: AC
  Administered 2024-01-21: 3 g via ORAL
  Filled 2024-01-21: qty 3

## 2024-01-21 NOTE — Discharge Instructions (Signed)
 You were seen in the emergency department for continued headache and burning and pain with urination.  You had lab work and a urinalysis, or given some medications for headache and a dose of fosfomycin for the urine infection.  We are prescribing you some Pyridium  which may help with the burning and stinging.  Please call your urologist and neurologist on Monday for close follow-up.

## 2024-01-21 NOTE — ED Triage Notes (Signed)
 Pt to ED via [facility] w/ c/o HA; vomited x 1 since arrival; continues to have dysuria; received 2 abx injections at urologist yesterday; awaiting culture results

## 2024-01-21 NOTE — ED Provider Notes (Signed)
 [place] [facility] Provider Note   CSN: [id number] Arrival date & time: 01/21/24  1844     History  Chief Complaint  Patient presents with   Headache    Shannon Bender is a 68 y.o. female.  She has brought in by family member who is translating at the patient's request.  She has longstanding headaches and migraines and follows with neurology.  She has also had ongoing urine infections for greater than 6 months.  She last saw urology 2 days ago and was given fosfomycin and a dose of gentamicin.  She saw neurology 2 days ago and the plan is to possibly start on some medication next week.  Daughter is asking if we can give her something for her headache now, she has had some success with a migraine cocktail.  And also something to help her dysuria and urinary frequency.   The history is provided by the patient and a relative.  Headache Pain location:  Generalized Severity currently:  10/10 Chronicity:  Recurrent Similar to prior headaches: yes   Ineffective treatments:  Prescription medications Associated symptoms: no cough, no diarrhea, no fever, no focal weakness, no neck stiffness, no visual change and no vomiting        Home Medications Prior to Admission medications   Medication Sig Start Date End Date Taking? Authorizing Provider  allopurinol (ZYLOPRIM) 300 MG tablet Take 300 mg by mouth daily. 04/16/21   [provider]  amLODipine (NORVASC) 5 MG tablet Take 5 mg by mouth daily. 04/16/21   [provider]  aspirin  EC 81 MG EC tablet Take 1 tablet (81 mg total) by mouth daily. Swallow whole. 06/07/21   Ghimire, Estil Heman, MD  atorvastatin  (LIPITOR) 40 MG tablet Take 1 tablet (40 mg total) by mouth daily. 06/07/21   Ghimire, Estil Heman, MD  canagliflozin (INVOKANA) 100 MG TABS tablet Take 100 mg by mouth daily before breakfast.    [provider]  cetirizine (ZYRTEC) 10 MG tablet Take 10 mg by mouth daily as needed for  allergies. 05/18/21   [provider]  clopidogrel  (PLAVIX ) 75 MG tablet Take 1 tablet (75 mg total) by mouth daily. 06/07/21   Ghimire, Estil Heman, MD  escitalopram  (LEXAPRO ) 10 MG tablet Take 10 mg by mouth daily. 04/16/21   [provider]  Etanercept (ENBREL MINI) 50 MG/ML SOCT Inject 50 mg into the skin once a week. Sundays 12/02/20   [provider]  folic acid (FOLVITE) 1 MG tablet Take 1 mg by mouth daily. 04/08/21   [provider]  ibuprofen (ADVIL) 200 MG tablet Take 400 mg by mouth every 6 (six) hours as needed for mild pain.    [provider]  insulin  NPH-regular Human (70-30) 100 UNIT/ML injection Inject 10-20 Units into the skin 2 (two) times daily. Takes 20 units in the morning and 10 units in the evening    [provider]  losartan (COZAAR) 25 MG tablet Take 25 mg by mouth daily. 04/16/21   [provider]  metFORMIN (GLUCOPHAGE) 850 MG tablet Take 850 mg by mouth 2 (two) times daily with a meal.    [provider]  nitrofurantoin , macrocrystal-monohydrate, (MACROBID ) 100 MG capsule Take 1 capsule (100 mg total) by mouth 2 (two) times daily. 01/04/24   Albertus Hughs, DO  pantoprazole  (PROTONIX ) 40 MG tablet Take 1 tablet (40 mg total) by mouth daily. 06/06/21 06/06/22  GhimireEstil Heman, MD  phenazopyridine  (PYRIDIUM ) 200  MG tablet Take 1 tablet (200 mg total) by mouth 3 (three) times daily as needed for pain. 01/04/24   Floyd, Dan, DO  sitaGLIPtin (JANUVIA) 50 MG tablet Take 50 mg by mouth daily.    [provider]  triamcinolone cream (KENALOG) 0.5 % Apply 1 application topically daily as needed (itching). 02/23/21   [provider]      Allergies    Patient has no known allergies.    Review of Systems   Review of Systems  Constitutional:  Negative for fever.  Respiratory:  Negative for cough.   Gastrointestinal:  Negative for diarrhea and vomiting.  Genitourinary:  Positive for dysuria and frequency.   Musculoskeletal:  Negative for neck stiffness.  Neurological:  Positive for headaches. Negative for focal weakness.    Physical Exam Updated Vital Signs BP (!) 167/76 (BP Location: Right Arm)   Pulse 81   Temp 98.1 F (36.7 C) (Oral)   Resp 18   Ht 5\' 3"  (1.6 m)   Wt 74.8 kg   SpO2 96%   BMI 29.21 kg/m  Physical Exam Vitals and nursing note reviewed.  Constitutional:      General: She is not in acute distress.    Appearance: Normal appearance. She is well-developed.  HENT:     Head: Normocephalic and atraumatic.  Eyes:     Conjunctiva/sclera: Conjunctivae normal.  Cardiovascular:     Rate and Rhythm: Normal rate and regular rhythm.     Heart sounds: No murmur heard. Pulmonary:     Effort: Pulmonary effort is normal. No respiratory distress.     Breath sounds: Normal breath sounds. No stridor. No wheezing.  Abdominal:     Palpations: Abdomen is soft.     Tenderness: There is no abdominal tenderness. There is no guarding or rebound.  Musculoskeletal:        General: No tenderness or deformity. Normal range of motion.     Cervical back: Neck supple.  Skin:    General: Skin is warm and dry.  Neurological:     General: No focal deficit present.     Mental Status: She is alert.     GCS: GCS eye subscore is 4. GCS verbal subscore is 5. GCS motor subscore is 6.     Cranial Nerves: No cranial nerve deficit.     Motor: No weakness.     Gait: Gait normal.     ED Results / Procedures / Treatments   Labs (all labs ordered are listed, but only abnormal results are displayed) Labs Reviewed  BASIC METABOLIC PANEL WITH GFR - Abnormal; Notable for the following components:      Result Value   Glucose, Bld 187 (*)    Creatinine, Ser 1.05 (*)    GFR, Estimated 58 (*)    All other components within normal limits  CBC WITH DIFFERENTIAL/PLATELET - Abnormal; Notable for the following components:   RDW 15.7 (*)    Platelets 118 (*)    All other components within normal limits   URINALYSIS, W/ REFLEX TO CULTURE (INFECTION SUSPECTED) - Abnormal; Notable for the following components:   Hgb urine dipstick MODERATE (*)    Protein, ur 100 (*)    Nitrite POSITIVE (*)    Leukocytes,Ua TRACE (*)    Bacteria, UA FEW (*)    All other components within normal limits    EKG None  Radiology No results found.  Procedures Procedures    Medications Ordered in ED Medications  prochlorperazine  (COMPAZINE )  injection 10 mg (10 mg Intravenous Given 01/21/24 1946)  diphenhydrAMINE  (BENADRYL ) injection 12.5 mg (12.5 mg Intravenous Given 01/21/24 1947)  phenazopyridine  (PYRIDIUM ) tablet 200 mg (200 mg Oral Given 01/21/24 1948)  ketorolac  (TORADOL ) 15 MG/ML injection 15 mg (15 mg Intravenous Given 01/21/24 2110)  fosfomycin (MONUROL ) packet 3 g (3 g Oral Given 01/21/24 2110)    ED Course/ Medical Decision Making/ A&P Clinical Course as of 01/22/24 1012  Sat Jan 21, 2024  2046 Headache is down to a 7.  It sounds like also she did not actually take the fosfomycin but got a dose of gentamicin and was was to pick up the fosfomycin at the pharmacy.  Pharmacy did not have the medication yet.  Will see if we can get her a dose here. [MB]  2135 She is feeling better after the Toradol .  She also got a dose of fosfomycin.  Recommended she follow-up with her urology team, call on Monday.  They are comfortable plan for discharge. [MB]    Clinical Course User Index [MB] Tonya Fredrickson, MD                                 Medical Decision Making Amount and/or Complexity of Data Reviewed Labs: ordered.  Risk Prescription drug management.   This patient complains of migraine headache, dysuria; this involves an extensive number of treatment Options and is a complaint that carries with it a high risk of complications and morbidity. The differential includes headache, stroke, bleed, UTI,  I ordered, reviewed and interpreted labs, which included CBC with chronically low platelets,  chemistries with elevated glucose elevated creatinine better than priors, urinalysis possible signs of infection I ordered medication migraine cocktail with improvement in her symptoms and reviewed PMP when indicated. Additional history obtained from patient's family member Previous records obtained and reviewed in epic including recent ED visits for similar presentation and imaging, notes from her neurologist and neurologist Cardiac monitoring reviewed, sinus rhythm Social determinants considered, stress Critical Interventions: None  After the interventions stated above, I reevaluated the patient and found patient to be symptomatically feeling better Admission and further testing considered, no indications for admission.  She is given a dose of oral antibiotics and recommend close follow-up with her treatment team.  They are comfortable plan for outpatient management.  Return instructions discussed         Final Clinical Impression(s) / ED Diagnoses Final diagnoses:  Generalized headache  Dysuria    Rx / DC Orders ED Discharge Orders          Ordered    phenazopyridine  (PYRIDIUM ) 200 MG tablet  3 times daily PRN        01/21/24 2136              Tonya Fredrickson, MD 01/22/24 1014

## 2024-07-25 ENCOUNTER — Encounter (HOSPITAL_BASED_OUTPATIENT_CLINIC_OR_DEPARTMENT_OTHER): Payer: Self-pay | Admitting: *Deleted

## 2024-07-25 ENCOUNTER — Emergency Department (HOSPITAL_BASED_OUTPATIENT_CLINIC_OR_DEPARTMENT_OTHER)
Admission: EM | Admit: 2024-07-25 | Discharge: 2024-07-25 | Disposition: A | Discharge status: Home / Self Care | Attending: Emergency Medicine | Admitting: Emergency Medicine

## 2024-07-25 ENCOUNTER — Other Ambulatory Visit (HOSPITAL_BASED_OUTPATIENT_CLINIC_OR_DEPARTMENT_OTHER): Payer: Self-pay

## 2024-07-25 ENCOUNTER — Other Ambulatory Visit: Payer: Self-pay

## 2024-07-25 DIAGNOSIS — Z794 Long term (current) use of insulin: Secondary | ICD-10-CM | POA: Insufficient documentation

## 2024-07-25 DIAGNOSIS — R3915 Urgency of urination: Secondary | ICD-10-CM | POA: Insufficient documentation

## 2024-07-25 DIAGNOSIS — N390 Urinary tract infection, site not specified: Secondary | ICD-10-CM | POA: Insufficient documentation

## 2024-07-25 DIAGNOSIS — Z7982 Long term (current) use of aspirin: Secondary | ICD-10-CM | POA: Diagnosis not present

## 2024-07-25 DIAGNOSIS — R739 Hyperglycemia, unspecified: Secondary | ICD-10-CM | POA: Diagnosis not present

## 2024-07-25 DIAGNOSIS — K029 Dental caries, unspecified: Secondary | ICD-10-CM | POA: Diagnosis not present

## 2024-07-25 DIAGNOSIS — Z7984 Long term (current) use of oral hypoglycemic drugs: Secondary | ICD-10-CM | POA: Insufficient documentation

## 2024-07-25 DIAGNOSIS — Z79899 Other long term (current) drug therapy: Secondary | ICD-10-CM | POA: Diagnosis not present

## 2024-07-25 DIAGNOSIS — N3289 Other specified disorders of bladder: Secondary | ICD-10-CM | POA: Diagnosis not present

## 2024-07-25 DIAGNOSIS — D696 Thrombocytopenia, unspecified: Secondary | ICD-10-CM | POA: Insufficient documentation

## 2024-07-25 DIAGNOSIS — K047 Periapical abscess without sinus: Secondary | ICD-10-CM | POA: Diagnosis not present

## 2024-07-25 DIAGNOSIS — K0889 Other specified disorders of teeth and supporting structures: Secondary | ICD-10-CM | POA: Diagnosis present

## 2024-07-25 LAB — BASIC METABOLIC PANEL WITH GFR
Anion gap: 14 (ref 5–15)
BUN: 17 mg/dL (ref 8–23)
CO2: 25 mmol/L (ref 22–32)
Calcium: 9.7 mg/dL (ref 8.9–10.3)
Chloride: 101 mmol/L (ref 98–111)
Creatinine, Ser: 0.95 mg/dL (ref 0.44–1.00)
GFR, Estimated: >60 mL/min (ref >60)
Glucose, Bld: 258 mg/dL — ABNORMAL HIGH (ref 70–99)
Potassium: 3.8 mmol/L (ref 3.5–5.1)
Sodium: 140 mmol/L (ref 135–145)

## 2024-07-25 LAB — CBC
HCT: 40.5 % (ref 36.0–46.0)
Hemoglobin: 13.5 g/dL (ref 12.0–15.0)
MCH: 30.5 pg (ref 26.0–34.0)
MCHC: 33.3 g/dL (ref 30.0–36.0)
MCV: 91.4 fL (ref 80.0–100.0)
Platelets: 100 K/uL — ABNORMAL LOW (ref 150–400)
RBC: 4.43 MIL/uL (ref 3.87–5.11)
RDW: 15.5 % (ref 11.5–15.5)
WBC: 8.2 K/uL (ref 4.0–10.5)
nRBC: 0 % (ref 0.0–0.2)

## 2024-07-25 LAB — URINALYSIS, MICROSCOPIC (REFLEX)

## 2024-07-25 LAB — URINALYSIS, ROUTINE W REFLEX MICROSCOPIC
Glucose, UA: 100 mg/dL — AB
Ketones, ur: NEGATIVE mg/dL
Leukocytes,Ua: NEGATIVE
Nitrite: NEGATIVE
Protein, ur: >=300 mg/dL — AB
Specific Gravity, Urine: >=1.03 (ref 1.005–1.030)
pH: 5.5 (ref 5.0–8.0)

## 2024-07-25 MED ORDER — DOXYCYCLINE HYCLATE 100 MG PO TABS
100.0000 mg | ORAL_TABLET | Freq: Once | ORAL | Status: AC
  Administered 2024-07-25: 100 mg via ORAL
  Filled 2024-07-25: qty 1

## 2024-07-25 MED ORDER — IBUPROFEN 400 MG PO TABS
400.0000 mg | ORAL_TABLET | Freq: Once | ORAL | Status: DC
  Filled 2024-07-25: qty 1

## 2024-07-25 MED ORDER — DOXYCYCLINE HYCLATE 100 MG PO CAPS
100.0000 mg | ORAL_CAPSULE | Freq: Two times a day (BID) | ORAL | 0 refills | Status: AC
  Filled 2024-07-25: qty 14, 7d supply, fill #0

## 2024-07-25 MED ORDER — ACETAMINOPHEN 500 MG PO TABS
1000.0000 mg | ORAL_TABLET | Freq: Once | ORAL | Status: AC
  Administered 2024-07-25: 1000 mg via ORAL
  Filled 2024-07-25: qty 2

## 2024-07-25 MED ORDER — TRAMADOL HCL 50 MG PO TABS
50.0000 mg | ORAL_TABLET | Freq: Four times a day (QID) | ORAL | 0 refills | Status: AC | PRN
  Filled 2024-07-25: qty 15, 4d supply, fill #0

## 2024-07-25 MED ORDER — AMOXICILLIN-POT CLAVULANATE 875-125 MG PO TABS
1.0000 | ORAL_TABLET | Freq: Two times a day (BID) | ORAL | 0 refills | Status: AC
  Filled 2024-07-25: qty 14, 7d supply, fill #0

## 2024-07-25 NOTE — Discharge Instructions (Addendum)
 It was our pleasure to provide your ER care today - we hope that you feel better. Take doxycycline and augmentin (antibiotics) as prescribed. Take acetaminophen  or ibuprofen as need for pain. You may also take ultram as need for pain - no driving when taking.   Follow up closely with dentist in the coming week - call office to arrange appointment (also may also follow up with oral surgeon listed if the dental follow up feels you need their services).  You may use information provided for urology follow up in the next 1-2 weeks - call office to arrange appointment. For high blood sugar and other medical issues, follow up closely with primary care doctor.   Return to ER if worse, new symptoms, fevers, intractable pain, severe facial swelling, trouble breathing or swallowing, severe abdominal pain, persistent vomiting, or other emergency concern.

## 2024-07-25 NOTE — ED Triage Notes (Signed)
 Pt is here for dental pain and swelling on the left side of her face which began yesterday.  Poor dentition, multiple broken teeth. No fever or chills. Pt also reports overactive bladder for one year. She has been seen for this by urology.

## 2024-07-25 NOTE — ED Provider Notes (Signed)
 [place] [facility] Provider Note   CSN: [id number] Arrival date & time: 07/25/24  1315     Patient presents with: Over Active Bladder and Dental Pain   Shannon Bender is a 68 y.o. female.   Pt with hx recurrent uti/chronic urinary urgency/freq in past 1-2 years, presents c/o left upper dental pain/swelling in past few days, and today return of mild urinary urgency.  Has been told has multiple dental caries, and likely needs some teeth pulled, but does not have local dentist. No fever or chills. No headache. No nv. No abd pain. No back/flank pain. No fever, chills or sweats. No current antibiotic use, other than having taking 1-2 doses of left over amoxicillin.  Family is requesting referral to dental f/u, and says would also like referral to urologist in [facility].  Pt speaks Urdu, pt/family request daughter as interpreter (AV device offered, but their preference is not to use).   The history is provided by the patient and a relative. A language interpreter was used.  Dental Pain Associated symptoms: no fever, no headaches and no neck pain        Prior to Admission medications   Medication Sig Start Date End Date Taking? Authorizing Provider  allopurinol (ZYLOPRIM) 300 MG tablet Take 300 mg by mouth daily. 04/16/21   [provider]  amLODipine (NORVASC) 5 MG tablet Take 5 mg by mouth daily. 04/16/21   [provider]  aspirin  EC 81 MG EC tablet Take 1 tablet (81 mg total) by mouth daily. Swallow whole. 06/07/21   Ghimire, Donalda HERO, MD  atorvastatin  (LIPITOR) 40 MG tablet Take 1 tablet (40 mg total) by mouth daily. 06/07/21   Ghimire, Donalda HERO, MD  canagliflozin (INVOKANA) 100 MG TABS tablet Take 100 mg by mouth daily before breakfast.    [provider]  cetirizine (ZYRTEC) 10 MG tablet Take 10 mg by mouth daily as needed for allergies. 05/18/21   [provider]  clopidogrel  (PLAVIX ) 75 MG tablet Take 1 tablet (75  mg total) by mouth daily. 06/07/21   Ghimire, Donalda HERO, MD  escitalopram  (LEXAPRO ) 10 MG tablet Take 10 mg by mouth daily. 04/16/21   [provider]  Etanercept (ENBREL MINI) 50 MG/ML SOCT Inject 50 mg into the skin once a week. Sundays 12/02/20   [provider]  folic acid (FOLVITE) 1 MG tablet Take 1 mg by mouth daily. 04/08/21   [provider]  ibuprofen (ADVIL) 200 MG tablet Take 400 mg by mouth every 6 (six) hours as needed for mild pain.    [provider]  insulin  NPH-regular Human (70-30) 100 UNIT/ML injection Inject 10-20 Units into the skin 2 (two) times daily. Takes 20 units in the morning and 10 units in the evening    [provider]  losartan (COZAAR) 25 MG tablet Take 25 mg by mouth daily. 04/16/21   [provider]  metFORMIN (GLUCOPHAGE) 850 MG tablet Take 850 mg by mouth 2 (two) times daily with a meal.    [provider]  nitrofurantoin , macrocrystal-monohydrate, (MACROBID ) 100 MG capsule Take 1 capsule (100 mg total) by mouth 2 (two) times daily. 01/04/24   Emil Share, DO  pantoprazole  (PROTONIX ) 40 MG tablet Take 1 tablet (40 mg total) by mouth daily. 06/06/21 06/06/22  Ghimire, Donalda HERO, MD  phenazopyridine  (PYRIDIUM ) 200 MG tablet Take 1 tablet (200 mg total) by mouth 3 (three) times daily as needed for pain. 01/21/24  Towana Ozell BROCKS, MD  sitaGLIPtin (JANUVIA) 50 MG tablet Take 50 mg by mouth daily.    [provider]  triamcinolone cream (KENALOG) 0.5 % Apply 1 application topically daily as needed (itching). 02/23/21   [provider]    Allergies: Patient has no known allergies.    Review of Systems  Constitutional:  Negative for fever.  HENT:  Negative for sore throat and trouble swallowing.   Eyes:  Negative for pain and redness.  Respiratory:  Negative for shortness of breath.   Cardiovascular:  Negative for chest pain.  Gastrointestinal:  Negative for abdominal pain and vomiting.   Genitourinary:  Positive for urgency. Negative for dysuria and flank pain.  Musculoskeletal:  Negative for back pain, neck pain and neck stiffness.  Skin:  Negative for rash.  Neurological:  Negative for headaches.    Updated Vital Signs BP (!) 141/67 (BP Location: Right Arm)   Pulse 85   Temp 98.4 F (36.9 C)   Resp 16   SpO2 92%   Physical Exam Vitals and nursing note reviewed.  Constitutional:      Appearance: Normal appearance. She is well-developed.  HENT:     Head: Atraumatic.     Comments: No gross facial swelling or redness.     Nose: Nose normal.     Mouth/Throat:     Mouth: Mucous membranes are moist.     Pharynx: Oropharynx is clear. No oropharyngeal exudate or posterior oropharyngeal erythema.     Comments: No severe trismus, is able to open mouth fully. Multiple dental caries and previously/chronically absent teeth. Pt with left upper dental decay with associated gum swelling. No fluctuance or drainable abscess noted.  No pain, swelling, or tenderness to floor of mouth or neck area.  Eyes:     General: No scleral icterus.    Conjunctiva/sclera: Conjunctivae normal.     Pupils: Pupils are equal, round, and reactive to light.  Neck:     Trachea: No tracheal deviation.     Comments: Trachea midline. No neck swelling or mass. No l/a.  Cardiovascular:     Rate and Rhythm: Normal rate and regular rhythm.     Pulses: Normal pulses.     Heart sounds: Normal heart sounds. No murmur heard.    No friction rub. No gallop.  Pulmonary:     Effort: Pulmonary effort is normal. No respiratory distress.     Breath sounds: Normal breath sounds.  Abdominal:     General: Bowel sounds are normal. There is no distension.     Palpations: Abdomen is soft. There is no mass.     Tenderness: There is no abdominal tenderness. There is no guarding.  Genitourinary:    Comments: No cva tenderness.  Musculoskeletal:        General: No swelling.     Cervical back: Normal range of motion  and neck supple. No rigidity. No muscular tenderness.  Lymphadenopathy:     Cervical: No cervical adenopathy.  Skin:    General: Skin is warm and dry.     Findings: No rash.  Neurological:     Mental Status: She is alert.     Comments: Alert, speech normal.   Psychiatric:        Mood and Affect: Mood normal.     (all labs ordered are listed, but only abnormal results are displayed) Results for orders placed or performed during the hospital encounter of 07/25/24  Urinalysis, Routine w reflex microscopic -Urine, Clean Catch  Collection Time: 07/25/24  1:27 PM  Result Value Ref Range   Color, Urine GREEN (A) YELLOW   APPearance HAZY (A) CLEAR   Specific Gravity, Urine >=1.030 1.005 - 1.030   pH 5.5 5.0 - 8.0   Glucose, UA 100 (A) NEGATIVE mg/dL   Hgb urine dipstick SMALL (A) NEGATIVE   Bilirubin Urine SMALL (A) NEGATIVE   Ketones, ur NEGATIVE NEGATIVE mg/dL   Protein, ur >=699 (A) NEGATIVE mg/dL   Nitrite NEGATIVE NEGATIVE   Leukocytes,Ua NEGATIVE NEGATIVE  Urinalysis, Microscopic (reflex)   Collection Time: 07/25/24  1:27 PM  Result Value Ref Range   RBC / HPF 0-5 0 - 5 RBC/hpf   WBC, UA 6-10 0 - 5 WBC/hpf   Bacteria, UA RARE (A) NONE SEEN   Squamous Epithelial / HPF 0-5 0 - 5 /HPF  Basic metabolic panel   Collection Time: 07/25/24  1:29 PM  Result Value Ref Range   Sodium 140 135 - 145 mmol/L   Potassium 3.8 3.5 - 5.1 mmol/L   Chloride 101 98 - 111 mmol/L   CO2 25 22 - 32 mmol/L   Glucose, Bld 258 (H) 70 - 99 mg/dL   BUN 17 8 - 23 mg/dL   Creatinine, Ser 9.04 0.44 - 1.00 mg/dL   Calcium  9.7 8.9 - 10.3 mg/dL   GFR, Estimated >39 >39 mL/min   Anion gap 14 5 - 15  CBC   Collection Time: 07/25/24  1:29 PM  Result Value Ref Range   WBC 8.2 4.0 - 10.5 K/uL   RBC 4.43 3.87 - 5.11 MIL/uL   Hemoglobin 13.5 12.0 - 15.0 g/dL   HCT 59.4 63.9 - 53.9 %   MCV 91.4 80.0 - 100.0 fL   MCH 30.5 26.0 - 34.0 pg   MCHC 33.3 30.0 - 36.0 g/dL   RDW 84.4 88.4 - 84.4 %   Platelets  100 (L) 150 - 400 K/uL   nRBC 0.0 0.0 - 0.2 %     EKG: None  Radiology: No results found.   Procedures   Medications Ordered in the ED  doxycycline (VIBRA-TABS) tablet 100 mg (has no administration in time range)  acetaminophen  (TYLENOL ) tablet 1,000 mg (has no administration in time range)  ibuprofen (ADVIL) tablet 400 mg (has no administration in time range)                                    Medical Decision Making Problems Addressed: Bladder wall thickening: chronic illness or injury Dental abscess: acute illness or injury with systemic symptoms that poses a threat to life or bodily functions Dental caries: chronic illness or injury Hyperglycemia: acute illness or injury Recurrent UTI: chronic illness or injury with exacerbation, progression, or side effects of treatment that poses a threat to life or bodily functions Thrombocytopenia: chronic illness or injury Urinary urgency: acute illness or injury  Amount and/or Complexity of Data Reviewed Independent Historian:     Details: Family, hx External Data Reviewed: notes. Labs: ordered. Decision-making details documented in ED Course. Radiology: independent interpretation performed. Decision-making details documented in ED Course.  Risk OTC drugs. Prescription drug management. Decision regarding hospitalization.  Labs ordered/sent.   Differential diagnosis includes  . Dispo decision including potential need for admission considered - will get labs and imaging and reassess.   Reviewed nursing notes and prior charts for additional history. External reports reviewed. Additional history from:  Cardiac monitor:  sinus rhythm, rate  Labs reviewed/interpreted by me - ua w 6-10 wbc (prior urine cultures in [facility] reviewed, most recent neg, multiple priors w klebsiella +, ?colonization vs acute utis. Wbc and hgb normal. Plt mildly low, similar to prior. Glucose mildly high, hco3 normal.   Prior CT imaging result from 2  months ago reviewed/interpreted by me - no acute process or obstructive uropathy then, chronically thickened bladder.   Rec close pcp/dental/urology f/u.  Return precautions provided.       Final diagnoses:  None    ED Discharge Orders     None          Bernard Drivers, MD 07/25/24 1558

## 2024-07-25 NOTE — ED Triage Notes (Signed)
 Translation English to Urdu by pt daughter (pt declined outside translation services) per pt request.

## 2024-07-25 NOTE — ED Notes (Signed)
 Pt was given hat and urine cup and advised that urine sample is needed

## 2024-08-25 ENCOUNTER — Emergency Department (HOSPITAL_BASED_OUTPATIENT_CLINIC_OR_DEPARTMENT_OTHER)
Admission: EM | Admit: 2024-08-25 | Discharge: 2024-08-25 | Disposition: A | Discharge status: Home / Self Care | Attending: Emergency Medicine | Admitting: Emergency Medicine

## 2024-08-25 ENCOUNTER — Encounter (HOSPITAL_BASED_OUTPATIENT_CLINIC_OR_DEPARTMENT_OTHER): Payer: Self-pay | Admitting: Emergency Medicine

## 2024-08-25 ENCOUNTER — Emergency Department (HOSPITAL_BASED_OUTPATIENT_CLINIC_OR_DEPARTMENT_OTHER)

## 2024-08-25 DIAGNOSIS — Z79899 Other long term (current) drug therapy: Secondary | ICD-10-CM | POA: Diagnosis not present

## 2024-08-25 DIAGNOSIS — Z794 Long term (current) use of insulin: Secondary | ICD-10-CM | POA: Insufficient documentation

## 2024-08-25 DIAGNOSIS — R112 Nausea with vomiting, unspecified: Secondary | ICD-10-CM

## 2024-08-25 DIAGNOSIS — Z8673 Personal history of transient ischemic attack (TIA), and cerebral infarction without residual deficits: Secondary | ICD-10-CM | POA: Insufficient documentation

## 2024-08-25 DIAGNOSIS — E109 Type 1 diabetes mellitus without complications: Secondary | ICD-10-CM | POA: Insufficient documentation

## 2024-08-25 DIAGNOSIS — I10 Essential (primary) hypertension: Secondary | ICD-10-CM | POA: Diagnosis not present

## 2024-08-25 DIAGNOSIS — Z7982 Long term (current) use of aspirin: Secondary | ICD-10-CM | POA: Diagnosis not present

## 2024-08-25 DIAGNOSIS — J690 Pneumonitis due to inhalation of food and vomit: Secondary | ICD-10-CM | POA: Diagnosis not present

## 2024-08-25 DIAGNOSIS — Z7984 Long term (current) use of oral hypoglycemic drugs: Secondary | ICD-10-CM | POA: Diagnosis not present

## 2024-08-25 DIAGNOSIS — Z7901 Long term (current) use of anticoagulants: Secondary | ICD-10-CM | POA: Insufficient documentation

## 2024-08-25 LAB — I-STAT VENOUS BLOOD GAS, ED
Acid-Base Excess: 3 mmol/L — ABNORMAL HIGH (ref 0.0–2.0)
Bicarbonate: 25.6 mmol/L (ref 20.0–28.0)
Calcium, Ion: 0.77 mmol/L — CL (ref 1.15–1.40)
HCT: 33 % — ABNORMAL LOW (ref 36.0–46.0)
Hemoglobin: 11.2 g/dL — ABNORMAL LOW (ref 12.0–15.0)
O2 Saturation: 94 %
Patient temperature: 97.8
Potassium: 3.5 mmol/L (ref 3.5–5.1)
Sodium: 142 mmol/L (ref 135–145)
TCO2: 26 mmol/L (ref 22–32)
pCO2, Ven: 30.2 mmHg — ABNORMAL LOW (ref 44–60)
pH, Ven: 7.534 — ABNORMAL HIGH (ref 7.25–7.43)
pO2, Ven: 58 mmHg — ABNORMAL HIGH (ref 32–45)

## 2024-08-25 LAB — COMPREHENSIVE METABOLIC PANEL WITH GFR
ALT: 27 U/L (ref 0–44)
AST: 38 U/L (ref 15–41)
Albumin: 4.2 g/dL (ref 3.5–5.0)
Alkaline Phosphatase: 82 U/L (ref 38–126)
Anion gap: 15 (ref 5–15)
BUN: 22 mg/dL (ref 8–23)
CO2: 24 mmol/L (ref 22–32)
Calcium: 10 mg/dL (ref 8.9–10.3)
Chloride: 103 mmol/L (ref 98–111)
Creatinine, Ser: 1.08 mg/dL — ABNORMAL HIGH (ref 0.44–1.00)
GFR, Estimated: 56 mL/min — ABNORMAL LOW (ref >60)
Glucose, Bld: 259 mg/dL — ABNORMAL HIGH (ref 70–99)
Potassium: 3.8 mmol/L (ref 3.5–5.1)
Sodium: 142 mmol/L (ref 135–145)
Total Bilirubin: 0.5 mg/dL (ref 0.0–1.2)
Total Protein: 6.9 g/dL (ref 6.5–8.1)

## 2024-08-25 LAB — CBC WITH DIFFERENTIAL/PLATELET
Abs Immature Granulocytes: 0.05 K/uL (ref 0.00–0.07)
Basophils Absolute: 0.1 K/uL (ref 0.0–0.1)
Basophils Relative: 1 %
Eosinophils Absolute: 0.2 K/uL (ref 0.0–0.5)
Eosinophils Relative: 2 %
HCT: 41.6 % (ref 36.0–46.0)
Hemoglobin: 13.8 g/dL (ref 12.0–15.0)
Immature Granulocytes: 1 %
Lymphocytes Relative: 25 %
Lymphs Abs: 1.9 K/uL (ref 0.7–4.0)
MCH: 30.1 pg (ref 26.0–34.0)
MCHC: 33.2 g/dL (ref 30.0–36.0)
MCV: 90.6 fL (ref 80.0–100.0)
Monocytes Absolute: 0.9 K/uL (ref 0.1–1.0)
Monocytes Relative: 13 %
Neutro Abs: 4.3 K/uL (ref 1.7–7.7)
Neutrophils Relative %: 58 %
Platelets: 94 K/uL — ABNORMAL LOW (ref 150–400)
RBC: 4.59 MIL/uL (ref 3.87–5.11)
RDW: 15.9 % — ABNORMAL HIGH (ref 11.5–15.5)
WBC: 7.4 K/uL (ref 4.0–10.5)
nRBC: 0 % (ref 0.0–0.2)

## 2024-08-25 LAB — LIPASE, BLOOD: Lipase: 19 U/L (ref 11–51)

## 2024-08-25 MED ORDER — ONDANSETRON 4 MG PO TBDP: 4.0000 mg | ORAL_TABLET | Freq: Three times a day (TID) | ORAL | 0 refills | Status: AC | PRN

## 2024-08-25 MED ORDER — SODIUM CHLORIDE 0.9 % IV BOLUS
1000.0000 mL | Freq: Once | INTRAVENOUS | Status: AC
  Administered 2024-08-25: 1000 mL via INTRAVENOUS

## 2024-08-25 MED ORDER — ONDANSETRON HCL 4 MG/2ML IJ SOLN
4.0000 mg | Freq: Once | INTRAMUSCULAR | Status: AC
  Administered 2024-08-25: 4 mg via INTRAVENOUS
  Filled 2024-08-25: qty 2

## 2024-08-25 MED ORDER — ALBUTEROL SULFATE (2.5 MG/3ML) 0.083% IN NEBU
5.0000 mg | INHALATION_SOLUTION | Freq: Once | RESPIRATORY_TRACT | Status: AC
  Administered 2024-08-25: 5 mg via RESPIRATORY_TRACT
  Filled 2024-08-25: qty 6

## 2024-08-25 NOTE — ED Provider Notes (Signed)
 [place] [facility] Provider Note   CSN: [id number] Arrival date & time: 08/25/24  2115     Patient presents with: Emesis   Shannon Bender is a 68 y.o. female.   Past medical history including prior CVA with left-sided deficits, hypertension, insulin -dependent T2DM, RA presenting with 2 days of nausea and vomiting following starting Ozempic.Per her daughter who acts as translator she was started on Ozempic on Friday by her PCP to help with her diabetes and weight loss.  She took her first dose Friday morning, and started feeling ill shortly afterward.  Since then she has been constantly nauseous and having some abdominal cramping and pain, and anytime she attempts to eat something she vomits.  She has not been able to keep down solids or liquids.  Any movement also elicits an episode of emesis.  Patient was observed to have emesis x 1 while I was in the room to examine her.  She has had no other symptoms including fever, chills, chest pain, shortness of breath.  Daughter does report that the night before she started Ozempic she had some chicken for dinner, so there is a possibility she may have food poisoning but otherwise she can think of no other sources.  No one else in the home is ill.  The history is provided by a relative. The history is limited by a language barrier. No language interpreter was used (Patient gave consent for daughter to translate).  Emesis Severity:  Moderate Duration:  2 days Timing:  Intermittent Quality:  Stomach contents      Prior to Admission medications   Medication Sig Start Date End Date Taking? Authorizing Provider  allopurinol (ZYLOPRIM) 300 MG tablet Take 300 mg by mouth daily. 04/16/21   [provider]  amLODipine (NORVASC) 5 MG tablet Take 5 mg by mouth daily. 04/16/21   [provider]  amoxicillin -clavulanate (AUGMENTIN ) 875-125 MG tablet Take 1 tablet by mouth every 12 (twelve) hours. 07/25/24    Bernard Drivers, MD  aspirin  EC 81 MG EC tablet Take 1 tablet (81 mg total) by mouth daily. Swallow whole. 06/07/21   Ghimire, Donalda HERO, MD  atorvastatin  (LIPITOR) 40 MG tablet Take 1 tablet (40 mg total) by mouth daily. 06/07/21   Ghimire, Donalda HERO, MD  canagliflozin (INVOKANA) 100 MG TABS tablet Take 100 mg by mouth daily before breakfast.    [provider]  cetirizine (ZYRTEC) 10 MG tablet Take 10 mg by mouth daily as needed for allergies. 05/18/21   [provider]  clopidogrel  (PLAVIX ) 75 MG tablet Take 1 tablet (75 mg total) by mouth daily. 06/07/21   Ghimire, Donalda HERO, MD  doxycycline  (VIBRAMYCIN ) 100 MG capsule Take 1 capsule (100 mg total) by mouth 2 (two) times daily. 07/25/24   Bernard Drivers, MD  escitalopram  (LEXAPRO ) 10 MG tablet Take 10 mg by mouth daily. 04/16/21   [provider]  Etanercept (ENBREL MINI) 50 MG/ML SOCT Inject 50 mg into the skin once a week. Sundays 12/02/20   [provider]  folic acid (FOLVITE) 1 MG tablet Take 1 mg by mouth daily. 04/08/21   [provider]  ibuprofen  (ADVIL ) 200 MG tablet Take 400 mg by mouth every 6 (six) hours as needed for mild pain.    [provider]  insulin  NPH-regular Human (70-30) 100 UNIT/ML injection Inject 10-20 Units into the skin 2 (two) times daily. Takes 20 units in the morning and 10 units in the evening  [provider]  losartan (COZAAR) 25 MG tablet Take 25 mg by mouth daily. 04/16/21   [provider]  metFORMIN (GLUCOPHAGE) 850 MG tablet Take 850 mg by mouth 2 (two) times daily with a meal.    [provider]  pantoprazole  (PROTONIX ) 40 MG tablet Take 1 tablet (40 mg total) by mouth daily. 06/06/21 06/06/22  Ghimire, Donalda HERO, MD  phenazopyridine  (PYRIDIUM ) 200 MG tablet Take 1 tablet (200 mg total) by mouth 3 (three) times daily as needed for pain. 01/21/24   Butler, Michael C, MD  sitaGLIPtin (JANUVIA) 50 MG tablet Take 50 mg by mouth daily.    [provider]  traMADol  (ULTRAM ) 50 MG tablet Take 1 tablet (50 mg total) by mouth every 6 (six) hours as needed. 07/25/24   Steinl, Kevin, MD  triamcinolone cream (KENALOG) 0.5 % Apply 1 application topically daily as needed (itching). 02/23/21   [provider]    Allergies: Patient has no known allergies.    Review of Systems  Gastrointestinal:  Positive for vomiting.    Updated Vital Signs BP (!) 165/78 (BP Location: Left Arm)   Pulse 92   Temp 98.5 F (36.9 C) (Oral)   Resp 18   Ht 5' (1.524 m)   Wt 75.8 kg   SpO2 96%   BMI 32.61 kg/m   Physical Exam Constitutional:      Appearance: She is ill-appearing.  HENT:     Mouth/Throat:     Mouth: Mucous membranes are moist.     Pharynx: Oropharynx is clear.  Eyes:     Extraocular Movements: Extraocular movements intact.     Pupils: Pupils are equal, round, and reactive to light.  Cardiovascular:     Rate and Rhythm: Regular rhythm. Tachycardia present.  Pulmonary:     Effort: Pulmonary effort is normal.     Breath sounds: Normal breath sounds.  Abdominal:     General: Abdomen is flat. Bowel sounds are normal. There is no distension.     Palpations: Abdomen is soft.     Tenderness: There is abdominal tenderness. There is no guarding or rebound.  Skin:    General: Skin is warm and dry.     Capillary Refill: Capillary refill takes less than 2 seconds.  Neurological:     General: No focal deficit present.     Mental Status: She is alert.     (all labs ordered are listed, but only abnormal results are displayed) Labs Reviewed  COMPREHENSIVE METABOLIC PANEL WITH GFR  LIPASE, BLOOD  CBC WITH DIFFERENTIAL/PLATELET    EKG: None  Radiology: No results found.   Procedures   Medications Ordered in the ED  sodium chloride  0.9 % bolus 1,000 mL (has no administration in time range)  ondansetron  (ZOFRAN ) injection 4 mg (4 mg Intravenous Given 08/25/24 2148)                                    Medical  Decision Making This is a 68 year old lady with a past medical history of insulin -dependent diabetes recently started on Ozempic.  Given her symptom onset correlates with starting this new medication this is the most likely diagnosis, however, we cannot exclude other infectious process or pancreatitis at this time.  Will obtain CBC, CMP and lipase as well as EKG to assess for ACS.  Will also give dose of Zofran  and fluid bolus and reassess.  22:16 Informed by nursing patient with difficulty breathing and pulse ox no greater than 93 with frequent dips into the 80s.  Started on 2 L of oxygen via nasal cannula, chest x-ray, albuterol , i-STAT venous blood gas ordered to assess for concurrent aspiration.  EKG without significant change from prior  22:39 chest x-ray without focal consolidation, CBC within normal limits, CMP without any significant abnormality.  Likely aspiration pneumonitis will reassess after nebulizer treatment is complete.  22: 52 weaned patient to room air and discussed patient course with daughter for approximately 5 minutes.  Patient's O2 sats remained greater than 90 throughout.  Will proceed with albuterol  and document new set of vitals.  Discontinuing Ozempic and using Zofran  as needed until nausea subsides.  May discharge if O2 remains stable after albuterol , signed out to oncoming attending at end of shift.  Amount and/or Complexity of Data Reviewed Labs: ordered. Radiology: ordered. ECG/medicine tests: ordered.  Risk Prescription drug management.     Final diagnoses:  None    ED Discharge Orders     None          Cleotilde Lukes, DO 08/25/24 2254    Patt Alm Macho, MD 08/25/24 [phone]

## 2024-08-25 NOTE — ED Notes (Signed)
 Discharge instructions reviewed with patient. Patient questions answered and opportunity for education reviewed. Patient voices understanding of discharge instructions with no further questions.

## 2024-08-25 NOTE — ED Triage Notes (Signed)
 Pt c/o NV, abd cramping after starting Ozempic yesterday

## 2024-09-16 ENCOUNTER — Encounter (HOSPITAL_BASED_OUTPATIENT_CLINIC_OR_DEPARTMENT_OTHER): Payer: Self-pay | Admitting: Emergency Medicine

## 2024-09-16 ENCOUNTER — Emergency Department (HOSPITAL_BASED_OUTPATIENT_CLINIC_OR_DEPARTMENT_OTHER)
Admission: EM | Admit: 2024-09-16 | Discharge: 2024-09-16 | Disposition: A | Discharge status: Home / Self Care | Attending: Emergency Medicine | Admitting: Emergency Medicine

## 2024-09-16 DIAGNOSIS — L03317 Cellulitis of buttock: Secondary | ICD-10-CM

## 2024-09-16 DIAGNOSIS — R739 Hyperglycemia, unspecified: Secondary | ICD-10-CM

## 2024-09-16 LAB — URINALYSIS, W/ REFLEX TO CULTURE (INFECTION SUSPECTED)
Bilirubin Urine: NEGATIVE
Glucose, UA: NEGATIVE mg/dL
Ketones, ur: 15 mg/dL — AB
Leukocytes,Ua: NEGATIVE
Nitrite: NEGATIVE
Protein, ur: >=300 mg/dL — AB
Specific Gravity, Urine: 1.025 (ref 1.005–1.030)
pH: 5.5 (ref 5.0–8.0)

## 2024-09-16 LAB — CBG MONITORING, ED: Glucose-Capillary: 203 mg/dL — ABNORMAL HIGH (ref 70–99)

## 2024-09-16 MED ORDER — DOXYCYCLINE HYCLATE 100 MG PO CAPS: 100.0000 mg | ORAL_CAPSULE | Freq: Two times a day (BID) | ORAL | 0 refills | Status: DC

## 2024-09-16 MED ORDER — DOXYCYCLINE HYCLATE 100 MG PO TABS
100.0000 mg | ORAL_TABLET | Freq: Once | ORAL | Status: AC
  Administered 2024-09-16: 100 mg via ORAL
  Filled 2024-09-16: qty 1

## 2024-09-16 MED ORDER — DOXYCYCLINE HYCLATE 100 MG PO CAPS: 100.0000 mg | ORAL_CAPSULE | Freq: Two times a day (BID) | ORAL | 0 refills | Status: AC

## 2024-09-16 NOTE — Discharge Instructions (Addendum)
 Today you were seen for cellulitis and hyperglycemia.  You appear mildly dehydrated while in the ED, make sure you are drinking plenty of water and checking your blood sugar.  Your workup on the emergency department was reassuring.  Thank you for letting us  treat you today. After reviewing your labs, I feel you are safe to go home. Please follow up with your PCP in the next several days and provide them with your records from this visit. Return to the Emergency Room if pain becomes severe or symptoms worsen.

## 2024-09-16 NOTE — ED Triage Notes (Signed)
 Pt's daughter reports pt's blood sugar is elevated (250-300s); has an abscess-like area to LT inner thigh

## 2024-09-16 NOTE — ED Notes (Signed)
 Pt's sugar is coming down per family member. They gave her short acting insulin  coverage.   Daughter visualized the abscess on the back L thigh with the provider. No foul play.

## 2024-09-16 NOTE — ED Notes (Signed)
 CBG 203, completed in triage

## 2024-09-16 NOTE — ED Notes (Signed)
 Urine sample sent to lab prior to order being placed. Per lab, they are now running the urine analysis.

## 2024-09-16 NOTE — ED Provider Notes (Signed)
 [place] [facility] Provider Note   CSN: [id number] Arrival date & time: 09/16/24  1730     Patient presents with: Abscess and Hyperglycemia   Shannon Bender is a 68 y.o. female presents today for hyperglycemia and concern for abscess to left posterior thigh and buttock.  Patient's daughter endorses dysuria.  Patient denies fever, chill, nausea, vomiting, polyuria, or polydipsia.    Abscess Hyperglycemia Associated symptoms: dysuria        Prior to Admission medications  Medication Sig Start Date End Date Taking? Authorizing Provider  allopurinol (ZYLOPRIM) 300 MG tablet Take 300 mg by mouth daily. 04/16/21   [provider]  amLODipine (NORVASC) 5 MG tablet Take 5 mg by mouth daily. 04/16/21   [provider]  amoxicillin -clavulanate (AUGMENTIN ) 875-125 MG tablet Take 1 tablet by mouth every 12 (twelve) hours. 07/25/24   Bernard Drivers, MD  aspirin  EC 81 MG EC tablet Take 1 tablet (81 mg total) by mouth daily. Swallow whole. 06/07/21   Ghimire, Donalda HERO, MD  atorvastatin  (LIPITOR) 40 MG tablet Take 1 tablet (40 mg total) by mouth daily. 06/07/21   Ghimire, Donalda HERO, MD  canagliflozin (INVOKANA) 100 MG TABS tablet Take 100 mg by mouth daily before breakfast.    [provider]  cetirizine (ZYRTEC) 10 MG tablet Take 10 mg by mouth daily as needed for allergies. 05/18/21   [provider]  clopidogrel  (PLAVIX ) 75 MG tablet Take 1 tablet (75 mg total) by mouth daily. 06/07/21   Ghimire, Donalda HERO, MD  doxycycline  (VIBRAMYCIN ) 100 MG capsule Take 1 capsule (100 mg total) by mouth 2 (two) times daily. 07/25/24   Steinl, Kevin, MD  doxycycline  (VIBRAMYCIN ) 100 MG capsule Take 1 capsule (100 mg total) by mouth 2 (two) times daily. 09/16/24   Francis Ileana SAILOR, PA-C  escitalopram  (LEXAPRO ) 10 MG tablet Take 10 mg by mouth daily. 04/16/21   [provider]  Etanercept (ENBREL MINI) 50 MG/ML SOCT Inject 50 mg into the skin  once a week. Sundays 12/02/20   [provider]  folic acid (FOLVITE) 1 MG tablet Take 1 mg by mouth daily. 04/08/21   [provider]  ibuprofen  (ADVIL ) 200 MG tablet Take 400 mg by mouth every 6 (six) hours as needed for mild pain.    [provider]  insulin  NPH-regular Human (70-30) 100 UNIT/ML injection Inject 10-20 Units into the skin 2 (two) times daily. Takes 20 units in the morning and 10 units in the evening    [provider]  losartan (COZAAR) 25 MG tablet Take 25 mg by mouth daily. 04/16/21   [provider]  metFORMIN (GLUCOPHAGE) 850 MG tablet Take 850 mg by mouth 2 (two) times daily with a meal.    [provider]  ondansetron  (ZOFRAN -ODT) 4 MG disintegrating tablet Take 1 tablet (4 mg total) by mouth every 8 (eight) hours as needed for nausea. 08/25/24   Cleotilde Lukes, DO  pantoprazole  (PROTONIX ) 40 MG tablet Take 1 tablet (40 mg total) by mouth daily. 06/06/21 06/06/22  Ghimire, Donalda HERO, MD  phenazopyridine  (PYRIDIUM ) 200 MG tablet Take 1 tablet (200 mg total) by mouth 3 (three) times daily as needed for pain. 01/21/24   Butler, Michael C, MD  sitaGLIPtin (JANUVIA) 50 MG tablet Take 50 mg by mouth daily.    [provider]  traMADol  (ULTRAM ) 50 MG tablet Take 1 tablet (50 mg total) by mouth every 6 (six) hours as needed. 07/25/24  Steinl, Kevin, MD  triamcinolone cream (KENALOG) 0.5 % Apply 1 application topically daily as needed (itching). 02/23/21   [provider]    Allergies: Lorazepam    Review of Systems  Genitourinary:  Positive for dysuria.  Skin:  Positive for wound.    Updated Vital Signs BP (!) 164/66   Pulse 80   Temp 99 F (37.2 C) (Oral)   Resp 20   Ht 5' (1.524 m)   Wt 75.8 kg   SpO2 97%   BMI 32.62 kg/m   Physical Exam Vitals and nursing note reviewed.  Constitutional:      General: She is not in acute distress.    Appearance: She is well-developed. She is not toxic-appearing.   HENT:     Head: Normocephalic and atraumatic.     Right Ear: External ear normal.     Left Ear: External ear normal.     Nose: Nose normal.     Mouth/Throat:     Mouth: Mucous membranes are moist.  Eyes:     Conjunctiva/sclera: Conjunctivae normal.  Cardiovascular:     Rate and Rhythm: Normal rate and regular rhythm.     Pulses: Normal pulses.     Heart sounds: Normal heart sounds. No murmur heard. Pulmonary:     Effort: Pulmonary effort is normal. No respiratory distress.     Breath sounds: Normal breath sounds.  Abdominal:     Palpations: Abdomen is soft.     Tenderness: There is no abdominal tenderness.  Musculoskeletal:        General: No swelling.     Cervical back: Neck supple.  Skin:    General: Skin is warm and dry.     Capillary Refill: Capillary refill takes less than 2 seconds.     Comments: Patient with approximately 2 cm diameter area of induration with minimal purulent discharge and no fluctuance noted to the left posterior thigh.  Patient also has an erythematous approximately 1 cm in diameter area of induration noted to the medial right buttock without any fluctuance or discharge.  Neurological:     General: No focal deficit present.     Mental Status: She is alert and oriented to person, place, and time.  Psychiatric:        Mood and Affect: Mood normal.     (all labs ordered are listed, but only abnormal results are displayed) Labs Reviewed  URINALYSIS, W/ REFLEX TO CULTURE (INFECTION SUSPECTED) - Abnormal; Notable for the following components:      Result Value   APPearance CLOUDY (*)    Hgb urine dipstick TRACE (*)    Ketones, ur 15 (*)    Protein, ur >=300 (*)    Bacteria, UA FEW (*)    All other components within normal limits  CBG MONITORING, ED - Abnormal; Notable for the following components:   Glucose-Capillary 203 (*)    All other components within normal limits    EKG: None  Radiology: No results found.   SABRAUltrasound ED Soft  Tissue  Date/Time: 09/16/2024 7:00 PM  Performed by: Francis Ileana SAILOR, PA-C Authorized by: Francis Ileana SAILOR, PA-C   Procedure details:    Indications: localization of abscess and evaluate for cellulitis     Transverse view:  Visualized   Longitudinal view:  Visualized   Images: not archived   Location:    Location: buttocks     Side:  Left Findings:     cellulitis present .Ultrasound ED Soft Tissue  Date/Time:  09/16/2024 7:00 PM  Performed by: Francis Ileana SAILOR, PA-C Authorized by: Francis Ileana SAILOR, PA-C   Procedure details:    Indications: localization of abscess and evaluate for cellulitis     Transverse view:  Visualized   Longitudinal view:  Visualized   Images: not archived   Location:    Location: lower extremity     Side:  Left Findings:     no abscess present    cellulitis present    Medications Ordered in the ED  doxycycline  (VIBRA -TABS) tablet 100 mg (100 mg Oral Given 09/16/24 1907)                                    Medical Decision Making Risk Prescription drug management.   lalThis patient presents to the ED for concern of hyperglycemia, dysuria, possible abscess differential diagnosis includes cellulitis, abscess, hyperglycemia, HHS, DKA, UTI, pyelonephritis    Additional history obtained   Additional history obtained from Electronic Medical Record External records from outside source obtained and reviewed including Care Everywhere   Lab Tests:  I Ordered, and personally interpreted labs.  The pertinent results include: CBG 203   Medicines ordered and prescription drug management:  I ordered medication including doxycycline     I have reviewed the patients home medicines and have made adjustments as needed   Problem List / ED Course:  Considered for admission or further workup however patient's vital signs, physical exam, labs, and imaging are reassuring.  Patient given outpatient course of doxycycline  for treatment of cellulitis.  Patient's  daughter also advised to have patient follow-up with primary care for further evaluation and treatment of diabetes.  Patient given return precautions.  I feel patient is safe for discharge at this time.      Final diagnoses:  Cellulitis of buttock  Hyperglycemia    ED Discharge Orders          Ordered    doxycycline  (VIBRAMYCIN ) 100 MG capsule  2 times daily,   Status:  Discontinued        09/16/24 2034    doxycycline  (VIBRAMYCIN ) 100 MG capsule  2 times daily        09/16/24 2042               Tashayla Therien N, PA-C 09/16/24 2338    Elnor Hila P, DO 09/18/24 1418
